# Patient Record
Sex: Female | Born: 1980 | Race: White | Hispanic: No | Marital: Married | Smoking: Former smoker
Health system: Southern US, Community
[De-identification: ages and names within clinical notes are randomized; demographics above are authoritative.]

## PROBLEM LIST (undated history)

## (undated) DIAGNOSIS — J351 Hypertrophy of tonsils: Secondary | ICD-10-CM

## (undated) DIAGNOSIS — E119 Type 2 diabetes mellitus without complications: Secondary | ICD-10-CM

## (undated) DIAGNOSIS — E785 Hyperlipidemia, unspecified: Secondary | ICD-10-CM

## (undated) DIAGNOSIS — O24419 Gestational diabetes mellitus in pregnancy, unspecified control: Secondary | ICD-10-CM

## (undated) DIAGNOSIS — U071 COVID-19: Secondary | ICD-10-CM

## (undated) HISTORY — DX: COVID-19: U07.1

## (undated) HISTORY — DX: Hypertrophy of tonsils: J35.1

## (undated) HISTORY — PX: NO PAST SURGERIES: SHX2092

## (undated) HISTORY — DX: Hyperlipidemia, unspecified: E78.5

## (undated) HISTORY — DX: Type 2 diabetes mellitus without complications: E11.9

## (undated) HISTORY — DX: Gestational diabetes mellitus in pregnancy, unspecified control: O24.419

---

## 2014-03-16 ENCOUNTER — Encounter: Payer: Self-pay | Admitting: Neurology

## 2014-03-19 ENCOUNTER — Institutional Professional Consult (permissible substitution): Payer: Self-pay | Admitting: Neurology

## 2016-05-25 LAB — OB RESULTS CONSOLE GC/CHLAMYDIA
Chlamydia: NEGATIVE
Gonorrhea: NEGATIVE

## 2016-05-25 LAB — OB RESULTS CONSOLE ANTIBODY SCREEN: ANTIBODY SCREEN: NEGATIVE

## 2016-05-25 LAB — OB RESULTS CONSOLE HEPATITIS B SURFACE ANTIGEN: HEP B S AG: NEGATIVE

## 2016-05-25 LAB — OB RESULTS CONSOLE ABO/RH: RH Type: POSITIVE

## 2016-05-25 LAB — OB RESULTS CONSOLE HIV ANTIBODY (ROUTINE TESTING): HIV: NONREACTIVE

## 2016-05-25 LAB — OB RESULTS CONSOLE RUBELLA ANTIBODY, IGM: RUBELLA: UNDETERMINED

## 2016-05-25 LAB — OB RESULTS CONSOLE RPR: RPR: NONREACTIVE

## 2016-09-21 ENCOUNTER — Encounter: Payer: Managed Care, Other (non HMO) | Attending: Obstetrics & Gynecology | Admitting: Skilled Nursing Facility1

## 2016-09-21 DIAGNOSIS — O99810 Abnormal glucose complicating pregnancy: Secondary | ICD-10-CM | POA: Insufficient documentation

## 2016-09-22 ENCOUNTER — Encounter: Payer: Self-pay | Admitting: Skilled Nursing Facility1

## 2016-09-22 NOTE — Progress Notes (Signed)
  Patient was seen on 09/22/2016 for Gestational Diabetes self-management class at the [facility]. The following learning objectives were met by the patient during this course:   States the definition of Gestational Diabetes  States why dietary management is important in controlling blood glucose  Describes the effects each nutrient has on blood glucose levels  Demonstrates ability to create a balanced meal plan  Demonstrates carbohydrate counting   States when to check blood glucose levels involving a total of 4 separate occurences in a day  Demonstrates proper blood glucose monitoring techniques  States the effect of stress and exercise on blood glucose levels  States the importance of limiting caffeine and abstaining from alcohol and smoking  Demonstrates the knowledge the glucometer provided in class may not be covered by their insurance and to call their insurance provider immediately after class to know which glucometer their insurance provider does cover as well as calling their physician the next day for a prescription to the glucometer their insurance does cover (if the one provided is not) as well as the lancets and strips for that meter.  Blood glucose monitor given: contour next Lot # [id number] Exp: 2017-03-28 Blood glucose reading: 89  Patient instructed to monitor glucose levels: FBS: 60 - <90 1 hour: <140 2 hour: <120  *Patient received handouts:  Nutrition Diabetes and Pregnancy  Carbohydrate Counting List  Patient will be seen for follow-up as needed.

## 2016-11-19 ENCOUNTER — Encounter (HOSPITAL_COMMUNITY): Payer: Self-pay | Admitting: *Deleted

## 2016-11-19 ENCOUNTER — Inpatient Hospital Stay (HOSPITAL_COMMUNITY): Payer: Managed Care, Other (non HMO) | Admitting: Anesthesiology

## 2016-11-19 ENCOUNTER — Inpatient Hospital Stay (HOSPITAL_COMMUNITY)
Admission: AD | Admit: 2016-11-19 | Discharge: 2016-11-22 | DRG: 775 | Disposition: A | Discharge status: Home / Self Care | Payer: Managed Care, Other (non HMO) | Source: Ambulatory Visit | Attending: Obstetrics and Gynecology | Admitting: Obstetrics and Gynecology

## 2016-11-19 DIAGNOSIS — Z8249 Family history of ischemic heart disease and other diseases of the circulatory system: Secondary | ICD-10-CM | POA: Diagnosis not present

## 2016-11-19 DIAGNOSIS — O26643 Intrahepatic cholestasis of pregnancy, third trimester: Secondary | ICD-10-CM | POA: Diagnosis present

## 2016-11-19 DIAGNOSIS — O24420 Gestational diabetes mellitus in childbirth, diet controlled: Secondary | ICD-10-CM | POA: Diagnosis present

## 2016-11-19 DIAGNOSIS — Z3A36 36 weeks gestation of pregnancy: Secondary | ICD-10-CM

## 2016-11-19 DIAGNOSIS — K831 Obstruction of bile duct: Secondary | ICD-10-CM | POA: Diagnosis present

## 2016-11-19 DIAGNOSIS — Z6790 Unspecified blood type, Rh positive: Secondary | ICD-10-CM

## 2016-11-19 DIAGNOSIS — O3663X0 Maternal care for excessive fetal growth, third trimester, not applicable or unspecified: Secondary | ICD-10-CM | POA: Diagnosis present

## 2016-11-19 DIAGNOSIS — O26613 Liver and biliary tract disorders in pregnancy, third trimester: Secondary | ICD-10-CM

## 2016-11-19 DIAGNOSIS — Z87891 Personal history of nicotine dependence: Secondary | ICD-10-CM

## 2016-11-19 DIAGNOSIS — Z833 Family history of diabetes mellitus: Secondary | ICD-10-CM

## 2016-11-19 DIAGNOSIS — O99214 Obesity complicating childbirth: Secondary | ICD-10-CM | POA: Diagnosis present

## 2016-11-19 DIAGNOSIS — O1420 HELLP syndrome (HELLP), unspecified trimester: Secondary | ICD-10-CM | POA: Diagnosis present

## 2016-11-19 DIAGNOSIS — Z3490 Encounter for supervision of normal pregnancy, unspecified, unspecified trimester: Secondary | ICD-10-CM

## 2016-11-19 DIAGNOSIS — Z6838 Body mass index (BMI) 38.0-38.9, adult: Secondary | ICD-10-CM

## 2016-11-19 DIAGNOSIS — O1424 HELLP syndrome, complicating childbirth: Secondary | ICD-10-CM | POA: Diagnosis present

## 2016-11-19 DIAGNOSIS — O2662 Liver and biliary tract disorders in childbirth: Secondary | ICD-10-CM | POA: Diagnosis present

## 2016-11-19 DIAGNOSIS — O1490 Unspecified pre-eclampsia, unspecified trimester: Secondary | ICD-10-CM | POA: Diagnosis present

## 2016-11-19 LAB — CBC
HCT: 41.7 % (ref 36.0–46.0)
HEMATOCRIT: 42.5 % (ref 36.0–46.0)
HEMOGLOBIN: 14.8 g/dL (ref 12.0–15.0)
Hemoglobin: 14.4 g/dL (ref 12.0–15.0)
MCH: 30.5 pg (ref 26.0–34.0)
MCH: 30.5 pg (ref 26.0–34.0)
MCHC: 34.8 g/dL (ref 30.0–36.0)
MCHC: 34.5 g/dL (ref 30.0–36.0)
MCV: 87.6 fL (ref 78.0–100.0)
MCV: 88.3 fL (ref 78.0–100.0)
PLATELETS: 177 10*3/uL (ref 150–400)
Platelets: 164 10*3/uL (ref 150–400)
RBC: 4.85 MIL/uL (ref 3.87–5.11)
RBC: 4.72 MIL/uL (ref 3.87–5.11)
RDW: 13.3 % (ref 11.5–15.5)
RDW: 13.3 % (ref 11.5–15.5)
WBC: 6.8 10*3/uL (ref 4.0–10.5)
WBC: 11.5 10*3/uL — AB (ref 4.0–10.5)

## 2016-11-19 LAB — COMPREHENSIVE METABOLIC PANEL
ALK PHOS: 194 U/L — AB (ref 38–126)
ALK PHOS: 201 U/L — AB (ref 38–126)
ALT: 568 U/L — AB (ref 14–54)
ALT: 618 U/L — ABNORMAL HIGH (ref 14–54)
ANION GAP: 9 (ref 5–15)
ANION GAP: 13 (ref 5–15)
AST: 241 U/L — ABNORMAL HIGH (ref 15–41)
AST: 281 U/L — ABNORMAL HIGH (ref 15–41)
Albumin: 3.1 g/dL — ABNORMAL LOW (ref 3.5–5.0)
Albumin: 3.2 g/dL — ABNORMAL LOW (ref 3.5–5.0)
BILIRUBIN TOTAL: 0.9 mg/dL (ref 0.3–1.2)
BUN: 15 mg/dL (ref 6–20)
BUN: 14 mg/dL (ref 6–20)
CALCIUM: 9 mg/dL (ref 8.9–10.3)
CHLORIDE: 101 mmol/L (ref 101–111)
CO2: 21 mmol/L — ABNORMAL LOW (ref 22–32)
CO2: 20 mmol/L — ABNORMAL LOW (ref 22–32)
CREATININE: 0.8 mg/dL (ref 0.44–1.00)
CREATININE: 0.82 mg/dL (ref 0.44–1.00)
Calcium: 9.4 mg/dL (ref 8.9–10.3)
Chloride: 103 mmol/L (ref 101–111)
Glucose, Bld: 90 mg/dL (ref 65–99)
Glucose, Bld: 83 mg/dL (ref 65–99)
Potassium: 4.2 mmol/L (ref 3.5–5.1)
Potassium: 4.6 mmol/L (ref 3.5–5.1)
SODIUM: 134 mmol/L — AB (ref 135–145)
Sodium: 133 mmol/L — ABNORMAL LOW (ref 135–145)
TOTAL PROTEIN: 7.6 g/dL (ref 6.5–8.1)
Total Bilirubin: 1 mg/dL (ref 0.3–1.2)
Total Protein: 7.6 g/dL (ref 6.5–8.1)

## 2016-11-19 LAB — APTT: aPTT: 28 seconds (ref 24–36)

## 2016-11-19 LAB — URIC ACID
URIC ACID, SERUM: 8.1 mg/dL — AB (ref 2.3–6.6)
Uric Acid, Serum: 7.8 mg/dL — ABNORMAL HIGH (ref 2.3–6.6)

## 2016-11-19 LAB — PROTIME-INR
INR: 0.93
PROTHROMBIN TIME: 12.4 s (ref 11.4–15.2)

## 2016-11-19 LAB — TYPE AND SCREEN
ABO/RH(D): AB POS
ANTIBODY SCREEN: NEGATIVE

## 2016-11-19 LAB — GLUCOSE, CAPILLARY: Glucose-Capillary: 117 mg/dL — ABNORMAL HIGH (ref 65–99)

## 2016-11-19 LAB — ABO/RH: ABO/RH(D): AB POS

## 2016-11-19 MED ORDER — LACTATED RINGERS IV SOLN: 500.0000 mL | Freq: Once | INTRAVENOUS | Status: DC

## 2016-11-19 MED ORDER — MAGNESIUM SULFATE BOLUS VIA INFUSION
4.0000 g | Freq: Once | INTRAVENOUS | Status: AC
  Administered 2016-11-19: 4 g via INTRAVENOUS
  Filled 2016-11-19: qty 500

## 2016-11-19 MED ORDER — LIDOCAINE HCL (PF) 1 % IJ SOLN
INTRAMUSCULAR | Status: DC | PRN
  Administered 2016-11-19 (×2): 4 mL

## 2016-11-19 MED ORDER — LACTATED RINGERS IV SOLN
INTRAVENOUS | Status: DC
  Administered 2016-11-19: 500 mL via INTRAVENOUS
  Administered 2016-11-19 – 2016-11-20 (×6): via INTRAVENOUS

## 2016-11-19 MED ORDER — FLEET ENEMA 7-19 GM/118ML RE ENEM: 1.0000 | ENEMA | RECTAL | Status: DC | PRN

## 2016-11-19 MED ORDER — OXYTOCIN BOLUS FROM INFUSION
500.0000 mL | Freq: Once | INTRAVENOUS | Status: AC
  Administered 2016-11-20: 500 mL via INTRAVENOUS

## 2016-11-19 MED ORDER — PENICILLIN G POT IN DEXTROSE 60000 UNIT/ML IV SOLN
3.0000 10*6.[IU] | INTRAVENOUS | Status: DC
  Administered 2016-11-20 (×2): 3 10*6.[IU] via INTRAVENOUS
  Filled 2016-11-19 (×8): qty 50

## 2016-11-19 MED ORDER — TERBUTALINE SULFATE 1 MG/ML IJ SOLN: 0.2500 mg | Freq: Once | INTRAMUSCULAR | Status: DC | PRN

## 2016-11-19 MED ORDER — OXYTOCIN 40 UNITS IN LACTATED RINGERS INFUSION - SIMPLE MED
2.5000 [IU]/h | INTRAVENOUS | Status: DC
  Filled 2016-11-19: qty 1000

## 2016-11-19 MED ORDER — ONDANSETRON HCL 4 MG/2ML IJ SOLN: 4.0000 mg | Freq: Four times a day (QID) | INTRAMUSCULAR | Status: DC | PRN

## 2016-11-19 MED ORDER — PENICILLIN G POTASSIUM 5000000 UNITS IJ SOLR
5.0000 10*6.[IU] | Freq: Once | INTRAVENOUS | Status: AC
  Administered 2016-11-19: 5 10*6.[IU] via INTRAVENOUS
  Filled 2016-11-19: qty 5

## 2016-11-19 MED ORDER — OXYCODONE-ACETAMINOPHEN 5-325 MG PO TABS: 2.0000 | ORAL_TABLET | ORAL | Status: DC | PRN

## 2016-11-19 MED ORDER — LACTATED RINGERS IV SOLN
500.0000 mL | INTRAVENOUS | Status: DC | PRN
  Administered 2016-11-19: 500 mL via INTRAVENOUS

## 2016-11-19 MED ORDER — ACETAMINOPHEN 325 MG PO TABS: 650.0000 mg | ORAL_TABLET | ORAL | Status: DC | PRN

## 2016-11-19 MED ORDER — EPHEDRINE 5 MG/ML INJ
10.0000 mg | INTRAVENOUS | Status: DC | PRN
  Filled 2016-11-19: qty 2

## 2016-11-19 MED ORDER — OXYTOCIN 40 UNITS IN LACTATED RINGERS INFUSION - SIMPLE MED
1.0000 m[IU]/min | INTRAVENOUS | Status: DC
  Administered 2016-11-19: 6 m[IU]/min via INTRAVENOUS
  Administered 2016-11-19: 14 m[IU]/min via INTRAVENOUS
  Administered 2016-11-19: 10 m[IU]/min via INTRAVENOUS
  Administered 2016-11-19 (×2): 4 m[IU]/min via INTRAVENOUS
  Administered 2016-11-19: 8 m[IU]/min via INTRAVENOUS
  Administered 2016-11-19: 2 m[IU]/min via INTRAVENOUS
  Administered 2016-11-19: 12 m[IU]/min via INTRAVENOUS
  Administered 2016-11-20: 16 m[IU]/min via INTRAVENOUS

## 2016-11-19 MED ORDER — LIDOCAINE HCL (PF) 1 % IJ SOLN
30.0000 mL | INTRAMUSCULAR | Status: DC | PRN
  Filled 2016-11-19: qty 30

## 2016-11-19 MED ORDER — FENTANYL 2.5 MCG/ML BUPIVACAINE 1/10 % EPIDURAL INFUSION (WH - ANES)
14.0000 mL/h | INTRAMUSCULAR | Status: DC | PRN
  Administered 2016-11-19 – 2016-11-20 (×2): 14 mL/h via EPIDURAL
  Filled 2016-11-19 (×2): qty 100

## 2016-11-19 MED ORDER — PHENYLEPHRINE 40 MCG/ML (10ML) SYRINGE FOR IV PUSH (FOR BLOOD PRESSURE SUPPORT)
80.0000 ug | PREFILLED_SYRINGE | INTRAVENOUS | Status: DC | PRN
  Administered 2016-11-19: 80 ug via INTRAVENOUS
  Filled 2016-11-19: qty 10
  Filled 2016-11-19: qty 5

## 2016-11-19 MED ORDER — DIPHENHYDRAMINE HCL 50 MG/ML IJ SOLN: 12.5000 mg | INTRAMUSCULAR | Status: DC | PRN

## 2016-11-19 MED ORDER — PHENYLEPHRINE 40 MCG/ML (10ML) SYRINGE FOR IV PUSH (FOR BLOOD PRESSURE SUPPORT)
80.0000 ug | PREFILLED_SYRINGE | INTRAVENOUS | Status: DC | PRN
  Administered 2016-11-19: 80 ug via INTRAVENOUS
  Filled 2016-11-19: qty 5

## 2016-11-19 MED ORDER — MAGNESIUM SULFATE 40 G IN LACTATED RINGERS - SIMPLE
2.0000 g/h | INTRAVENOUS | Status: DC
  Administered 2016-11-20 – 2016-11-21 (×2): 2 g/h via INTRAVENOUS
  Filled 2016-11-19: qty 40
  Filled 2016-11-19: qty 500
  Filled 2016-11-19: qty 40

## 2016-11-19 MED ORDER — OXYCODONE-ACETAMINOPHEN 5-325 MG PO TABS: 1.0000 | ORAL_TABLET | ORAL | Status: DC | PRN

## 2016-11-19 MED ORDER — SOD CITRATE-CITRIC ACID 500-334 MG/5ML PO SOLN: 30.0000 mL | ORAL | Status: DC | PRN

## 2016-11-19 MED ORDER — FENTANYL 2.5 MCG/ML BUPIVACAINE 1/10 % EPIDURAL INFUSION (WH - ANES): 14.0000 mL/h | INTRAMUSCULAR | Status: DC | PRN

## 2016-11-19 NOTE — Progress Notes (Signed)
ANTEPARTUM NOTE - Hospital Day 1  Subjective: S/p epidural, was beginning to feel contractions as more uncomfortable; still anxious In room just after epidural and pt feeling lightheaded and sweaty  Objective: Vital signs: VS: Blood pressure 124/85, pulse 97, temperature 98.4 F (36.9 C), temperature source Oral, resp. rate 20, height 5' 4"  (1.626 m), weight 227 lb (103 kg), SpO2 98 %. Physical exam:   I/O:  In total: 4600 Output documented: 569m; please note that this does not accurately reflect pt output as I visualized hat with urine that was apparently not documented; at this time uncertain as to the patient's voiding   Patient Vitals for the past 24 hrs:  BP Temp Temp src Pulse Resp SpO2 Height Weight  11/19/16 2051 124/85 - - 97 - - - -  11/19/16 2033 (!) 93/56 - - 88 - - - -  11/19/16 2031 (!) 88/57 - - 88 - - - -  11/19/16 2028 (!) 82/54 - - 93 - - - -  11/19/16 2025 (!) 115/93 - - (!) 110 - 98 % - -  11/19/16 2021 99/81 - - (!) 128 - - - -  11/19/16 2017 - - - - - 97 % - -  11/19/16 2010 - - - - - 97 % - -  11/19/16 2005 (!) 161/111 - - (!) 123 - 96 % - -  11/19/16 2000 (!) 161/119 - - (!) 107 - 97 % - -  11/19/16 1831 (!) 157/88 - - (!) 114 - - - -  11/19/16 1801 (!) 157/96 - - (!) 110 - - - -  11/19/16 1731 (!) 165/87 98.4 F (36.9 C) Oral (!) 115 20 - - -  11/19/16 1714 (!) 146/92 - - (!) 107 - - - -  11/19/16 1608 138/86 98.6 F (37 C) Oral (!) 102 18 - - -  11/19/16 1400 113/75 - - (!) 105 - 96 % - -  11/19/16 1355 - - - - - 97 % - -  11/19/16 1352 (!) 147/88 - - (!) 107 - - - -  11/19/16 1341 (!) 133/92 - - 97 - - - -  11/19/16 1331 (!) 137/94 - - 98 - - - -  11/19/16 1321 (!) 125/94 - - 100 18 - - -  11/19/16 1251 - - - - 18 - - -  11/19/16 1207 (!) 131/99 - - 99 18 - - -  11/19/16 1155 (!) 136/101 97.9 F (36.6 C) Oral (!) 107 18 - 5' 4"  (1.626 m) 227 lb (103 kg)          General appearance/behavior: alert&orientedx3              Lungs: nml  respirations       Abdomen: soft, nt, gravid       Cx: 58/54/-6 cephalic, clear fluid; no blood noted except small old clot; iupc placed       Extremities: no edema, nt bilat                Fetal Assessment:  FHR 140s, +accels, 2 late decels during hypotension after epidural, lasted about 1 min each with return to baseline; cat 1-2 TOCO irreg q 2-4   Assessment/Plan: iup at 36 2 1.  HELLP: repeated labs again with nml coags (ordered d/t increased bleeding), plts stable, slight increase in lfts; contin plan with pitocin IOL; watch closely for any change; please note that with nursing turn over, it was discovered that  the pitocin was not running as ordered and was barely getting pitocin during this indxn time d/t nursing error setting pump; this was corrected and plan to increase by 73m/min q 30 min as ordered and this plan was reviewed with the new nursing team; plan svd.  Reviewed contin mag sulfate and follow I/os closely; plan repeat labs in  6 hours if not delivered; watch for further bleeding; possible cholestasis of pregnancy as bile acids not yet back; no signs/sx mag toxicity; prior nurse did not record I/o's and appears output was not entered; it is likely that deficit is not as severe as it appears since I witnessed a void that is not documented; current team aware of strict I/o's and need to watch pt closely 2. Fetal status overall reassuring - no further decelerations  And obs closely 3. gdma1 - check fsbs q 4 hr, stable and wnl 4. gbs unknown - pcn for prophylaxis; this was not yet started by prior nurse per order and new nurse team aware to begin now, pt in active labor 5. Rubella equiv - mmr pp

## 2016-11-19 NOTE — Progress Notes (Signed)
Nursing called to inform of blood in hat after pt had voided; reported red clot about 3cm; no active bleeding noted Pt says happened about 45 min ago Pt w/o c/o, beginning to feel contractions No n/v, sob; no c/o; pt is very anxious about all of the medical issues   Vs: 130s-140s/80s-90s; did have diastolic of 101 on admission 102, 18, 98.6  A&x3 Abd: soft, nt, gravid Cx: 4/80/-3, cephalic, arom with clear and ?blood tinged fluid; clotted blood noted on perineum during exam and arom, no active bleeding noted with arom, appeared clear fluid that was blood tinged, again no frank blood noted LE: no edema, nt bilat  Intake/Output Summary (Last 24 hours) at 11/19/16 1808 Last data filed at 11/19/16 1755  Gross per 24 hour  Intake          1069.57 ml  Output              300 ml  Net           769.57 ml     FHT: 140s, +accels, no decels, cat 1 Toco: irreg, q 2-3 min  A/p: iup at 36 2, HELLP 1. D/t increased bleeding will repeat labs with coags, cbc/cmp/pt/ptt/inr; will observe closely for any further bleeding, appears that this is not active bleeding and may have occurred with cervical change, no blood noted with arom; possible abruption and will follow closely; I have reviewed all of this with the patient and will continue the pitocin at 294mU/min for now until stable; reviewed likely c/s for any further bleeding episodes or change in status (maternal or fetal); pt and husband aware and questions answered 2. Fetal status reassuring 3. HELLP: repeat labs pending, contin mag sulfate; follow I/o's closely; bps wnl or low mild range 4. GDMA1: has been controlled with diet, fsbs wnl on admission,

## 2016-11-19 NOTE — Anesthesia Preprocedure Evaluation (Addendum)
Anesthesia Evaluation  Patient identified by MRN, date of birth, ID band Patient awake    Reviewed: Allergy & Precautions, NPO status , Patient's Chart, lab work & pertinent test results  Airway Mallampati: II  TM Distance: >3 FB Neck ROM: Full    Dental no notable dental hx.    Pulmonary neg pulmonary ROS, former smoker,    Pulmonary exam normal breath sounds clear to auscultation       Cardiovascular hypertension, Normal cardiovascular exam Rhythm:Regular Rate:Normal     Neuro/Psych negative neurological ROS  negative psych ROS   GI/Hepatic negative GI ROS, Patient received Oral Contrast Agents,(+) Hepatitis -HELLP   Endo/Other  diabetesMorbid obesity  Renal/GU negative Renal ROS     Musculoskeletal negative musculoskeletal ROS (+)   Abdominal   Peds  Hematology negative hematology ROS (+)   Anesthesia Other Findings   Reproductive/Obstetrics (+) Pregnancy                            Anesthesia Physical Anesthesia Plan  ASA: III  Anesthesia Plan: Epidural   Post-op Pain Management:    Induction:   Airway Management Planned:   Additional Equipment:   Intra-op Plan:   Post-operative Plan:   Informed Consent: I have reviewed the patients History and Physical, chart, labs and discussed the procedure including the risks, benefits and alternatives for the proposed anesthesia with the patient or authorized representative who has indicated his/her understanding and acceptance.     Plan Discussed with:   Anesthesia Plan Comments:        Anesthesia Quick Evaluation

## 2016-11-19 NOTE — H&P (Addendum)
OB ADMISSION/ HISTORY & PHYSICAL:  Admission Date: 11/19/2016 11:28 AM  Admit Diagnosis: HELLP  Mary Barrett is a 36 y.o. female at 10 2 wga by early u/s sent from office to l/d for delivery d/t abnormal lab tests.  The patient was seen 1 day ago for itching and PIH labs done.  Plts 149, with AST 230 and ALT 610 and resulted this am.  The patient presented w/o c/o headache, vision changes, RUQ pain, denies any vaginal bleeding, contractions or leaking fluid.  +FM    Prenatal History: G5P0040   EDC : 12/15/2016, by Other Basis  Prenatal care at [facility] Provider: Mody Prenatal course complicated by GDMA1 that was controlled with diet only  Prenatal Labs: ABO, Rh: --/--/AB POS, AB POS (05/24 1220) Antibody: NEG (05/24 1220) Rubella: Equivocal (11/27 0000)  RPR: Nonreactive (11/27 0000)  HBsAg: Negative (11/27 0000)  HIV: Non-reactive (11/27 0000)  GTT: failed 3 hr GBS:  done 5/23 and not yet avail, unknown  Medical / Surgical History :  Past medical history:  Past Medical History:  Diagnosis Date  . Diabetes mellitus without complication ([place])   . Tonsillar hypertrophy      Past surgical history:  Past Surgical History:  Procedure Laterality Date  . NO PAST SURGERIES      Family History:  Family History  Problem Relation Age of Onset  . Diabetes Mother   . Hypertension Mother   . Diabetes Father   . Hypertension Father   . Cancer Maternal Grandmother   . Alcoholism Brother      Social History:  reports that she quit smoking about 2 years ago. She has never used smokeless tobacco. She reports that she does not drink alcohol or use drugs.   Allergies: Patient has no known allergies.    Current Medications at time of admission:  Prior to Admission medications   Medication Sig Start Date End Date Taking? Authorizing Provider  omega-3 acid ethyl esters (LOVAZA) 1 g capsule Take 1 g by mouth daily.   Yes [provider]  Prenatal Vit-Fe  Fumarate-FA (PRENATAL MULTIVITAMIN) TABS tablet Take 1 tablet by mouth daily at 12 noon.   Yes [provider]     Review of Systems: See above HPI  Physical Exam: Vs: 125-140s/80s-90s General: alert and oriented, appears in no distress Heart: RRR Lungs: Clear lung fields Abdomen: Gravid, soft and non-tender, gravid; efw 6 1/2-7 lbs Extremities: tr edema CX: 9/37/-9, cephalic, mid to posterior   FHR: baseline rate 140s / variability nml / accelerations + / no decelerations; cat 1 TOCO: irregular q4-8    Labs reviewed: ast 241, alt 568, plts 164, otherwise wnl   Assessment/Plan: G5P0040 at 36 [redacted] weeks gestation 1. hellp - admit for delivery; reviewed stable labs and with cervical exam, recommend plan for IOL with pitocin, will follow closely and if worsening maternal or fetal status will reassess; on magnesium sulfate, plan arom with descent of fetal head; bps wnl or low mild range and will follow closely; strict I/o; uric acid done at office but no results avail 2. Fetal status reassuring, contin efm 3. gdma1 - fsbs with admission 98, check q 4 hrs in latent labor 4. gbs - no result available, plan gbs prophylaxis if unable to get result (done 1 day ago) 5. Rh pos 6. Rubella equiv - mmr pp

## 2016-11-19 NOTE — Anesthesia Procedure Notes (Addendum)
Epidural Patient location during procedure: OB  Staffing Anesthesiologist: Mellissa Conley Performed: anesthesiologist   Preanesthetic Checklist Completed: patient identified, pre-op evaluation, timeout performed, IV checked, risks and benefits discussed and monitors and equipment checked  Epidural Patient position: sitting Prep: site prepped and draped and DuraPrep Patient monitoring: heart rate, continuous pulse ox and blood pressure Approach: midline Location: L3-L4 Injection technique: LOR air and LOR saline  Needle:  Needle type: Tuohy  Needle gauge: 17 G Needle length: 9 cm Needle insertion depth: 7 cm Catheter type: closed end flexible Catheter size: 19 Gauge Catheter at skin depth: 13 cm Test dose: negative  Assessment Sensory level: T8 Events: blood not aspirated, injection not painful, no injection resistance, negative IV test and no paresthesia  Additional Notes Reason for block:procedure for pain     

## 2016-11-19 NOTE — H&P (Signed)
Leafy Halfnnmarie Gibb is a 36 y.o. 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  [id number], spontaneous pregnancy.  GDM- A1, well controlled.  She presented to office yesterday with itching, bile acids sent and CBC, LFT sent. Labs back this morning with elevated LFTs and low platelets, so pt was asked to present for delivery. Denies HA/ blurred vision/ RUQ pain/ swelling.  Anxious due to this diagnosis   OB History    Gravida Para Term Preterm AB Living   5 0 0 0 4 0   SAB TAB Ectopic Multiple Live Births   1 3 0 0 0     Past Medical History:  Diagnosis Date  . Diabetes mellitus without complication ([facility])   . Tonsillar hypertrophy    Past Surgical History:  Procedure Laterality Date  . NO PAST SURGERIES     Family History: family history includes Alcoholism in her brother; Cancer in her maternal grandmother; Diabetes in her father and mother; Hypertension in her father and mother. Social History:  reports that she quit smoking about 2 years ago. She has never used smokeless tobacco. She reports that she does not drink alcohol or use drugs.     Maternal Diabetes: Yes:  Diabetes Type:  Diet controlled Genetic Screening: Normal- Panorama normal  Maternal Ultrasounds/Referrals: Normal Fetal Ultrasounds or other Referrals:  None Maternal Substance Abuse:  No Significant Maternal Medications:  None Significant Maternal Lab Results:  Lab values include: Other:  GBS pending  Other Comments: none  ROS History Dilation: 2.5 Station:  (high) Exam by:: Mycah Mcdougall Blood pressure (!) 131/99, pulse 99, temperature 97.9 F (36.6 C), temperature source Oral, resp. rate 18, height 5\' 4"  (1.626 m), weight 227 lb (103 kg). Exam Physical Exam  Physical exam:  A&O x 3, no acute distress. Pleasant HEENT neg, no thyromegaly Lungs CTA bilat CV RRR, S1S2 normal Abdo soft, non tender, non acute Extr no edema/ tenderness Pelvic 2-3 cm/ soft cx, 50%/ -3 at inlet. Android  pelvis FHT  140s/ mod variab/ no decels/ +accels Toco regular q 4-5 min, pt not appreciating any  Prenatal labs: ABO, Rh: AB/Positive/-- (11/27 0000) Antibody: Negative (11/27 0000) Rubella: Equivocal (11/27 0000) RPR: Nonreactive (11/27 0000)  HBsAg: Negative (11/27 0000)  HIV: Non-reactive (11/27 0000)  GBS:   pending Glucola abnormal  Assessment/Plan:36 yo female G5P0040, AMA, spontaneous pregnancy. A1GDM well controlled, now with HELLP syndrome at 36.3 wks. Admitted for delivery. Reviewed PEC and HELLP, risk to mother incl seizure/ bleeding/ liver hematoma/ pulm edema/ etc, Neo risks incl abruption/ fetal distress etc. Currently BPs good and no indication to start antiHTN meds. Fresh labs sent at admission, continue every 6 hrs. Inform anesthesia. Rapid GBS not sent as pt just had a pelvic exam, will start PCN per protocol. Reviewed Eclampsia risk, recommend magnesium sulphate and agrees, risks/ side effects . Cholestasis not rued out as office bile acids pending.  Favorable cervix. Reviewed C/section vs IOL risks/ benefits. Pt would have preferred vaginal delivery but accepts C/section as needed. D/w on call MD Dr Amado NashAlmquist, pitocin seems reasonable due to cx 2-3 cm and not except LGA infant. Borderline/ Android pelvis reviewed with pt and Dr Amado NashAlmquist and pt prepared for C/section as needed. reviewed.  Pt counseled and d/w Dr Amado NashAlmquist who will assume care for this patient.    Regnia Mathwig R 11/19/2016, 1:13 PM

## 2016-11-19 NOTE — Anesthesia Pain Management Evaluation Note (Signed)
  CRNA Pain Management Visit Note  Patient: Mary Barrett, 36 y.o., female  "Hello I am a member of the anesthesia team at [facility]. We have an anesthesia team available at all times to provide care throughout the hospital, including epidural management and anesthesia for C-section. I don't know your plan for the delivery whether it a natural birth, water birth, IV sedation, nitrous supplementation, doula or epidural, but we want to meet your pain goals."   1.Was your pain managed to your expectations on prior hospitalizations?   No prior hospitalizations  2.What is your expectation for pain management during this hospitalization?     Epidural  3.How can we help you reach that goal? unsure  Record the patient's initial score and the patient's pain goal.   Pain: 0  Pain Goal: 8 [facility] wants you to be able to say your pain was always managed very well.  Cephus ShellingBURGER,[place] 11/19/2016

## 2016-11-20 ENCOUNTER — Encounter (HOSPITAL_COMMUNITY): Payer: Self-pay

## 2016-11-20 DIAGNOSIS — O26613 Liver and biliary tract disorders in pregnancy, third trimester: Secondary | ICD-10-CM

## 2016-11-20 DIAGNOSIS — K831 Obstruction of bile duct: Secondary | ICD-10-CM | POA: Diagnosis present

## 2016-11-20 DIAGNOSIS — O26643 Intrahepatic cholestasis of pregnancy, third trimester: Secondary | ICD-10-CM | POA: Diagnosis present

## 2016-11-20 LAB — CBC
HCT: 35.4 % — ABNORMAL LOW (ref 36.0–46.0)
HCT: 32.2 % — ABNORMAL LOW (ref 36.0–46.0)
HEMATOCRIT: 39.4 % (ref 36.0–46.0)
HEMOGLOBIN: 13.5 g/dL (ref 12.0–15.0)
Hemoglobin: 12.1 g/dL (ref 12.0–15.0)
Hemoglobin: 11.2 g/dL — ABNORMAL LOW (ref 12.0–15.0)
MCH: 30 pg (ref 26.0–34.0)
MCH: 30.1 pg (ref 26.0–34.0)
MCH: 30.5 pg (ref 26.0–34.0)
MCHC: 34.2 g/dL (ref 30.0–36.0)
MCHC: 34.3 g/dL (ref 30.0–36.0)
MCHC: 34.8 g/dL (ref 30.0–36.0)
MCV: 87.7 fL (ref 78.0–100.0)
MCV: 87.8 fL (ref 78.0–100.0)
MCV: 87.9 fL (ref 78.0–100.0)
PLATELETS: 166 10*3/uL (ref 150–400)
PLATELETS: 166 10*3/uL (ref 150–400)
Platelets: 164 10*3/uL (ref 150–400)
RBC: 3.67 MIL/uL — ABNORMAL LOW (ref 3.87–5.11)
RBC: 4.03 MIL/uL (ref 3.87–5.11)
RBC: 4.48 MIL/uL (ref 3.87–5.11)
RDW: 13.4 % (ref 11.5–15.5)
RDW: 13.4 % (ref 11.5–15.5)
RDW: 13.6 % (ref 11.5–15.5)
WBC: 12.5 10*3/uL — ABNORMAL HIGH (ref 4.0–10.5)
WBC: 17.6 10*3/uL — AB (ref 4.0–10.5)
WBC: 22.2 10*3/uL — ABNORMAL HIGH (ref 4.0–10.5)

## 2016-11-20 LAB — COMPREHENSIVE METABOLIC PANEL
ALBUMIN: 2.5 g/dL — AB (ref 3.5–5.0)
ALT: 453 U/L — ABNORMAL HIGH (ref 14–54)
ALT: 606 U/L — ABNORMAL HIGH (ref 14–54)
ANION GAP: 10 (ref 5–15)
ANION GAP: 9 (ref 5–15)
AST: 208 U/L — AB (ref 15–41)
AST: 292 U/L — ABNORMAL HIGH (ref 15–41)
Albumin: 3 g/dL — ABNORMAL LOW (ref 3.5–5.0)
Alkaline Phosphatase: 135 U/L — ABNORMAL HIGH (ref 38–126)
Alkaline Phosphatase: 169 U/L — ABNORMAL HIGH (ref 38–126)
BILIRUBIN TOTAL: 1.4 mg/dL — AB (ref 0.3–1.2)
BUN: 13 mg/dL (ref 6–20)
BUN: 14 mg/dL (ref 6–20)
CALCIUM: 8 mg/dL — AB (ref 8.9–10.3)
CHLORIDE: 103 mmol/L (ref 101–111)
CO2: 21 mmol/L — AB (ref 22–32)
CO2: 21 mmol/L — ABNORMAL LOW (ref 22–32)
CREATININE: 0.86 mg/dL (ref 0.44–1.00)
Calcium: 7.3 mg/dL — ABNORMAL LOW (ref 8.9–10.3)
Chloride: 99 mmol/L — ABNORMAL LOW (ref 101–111)
Creatinine, Ser: 0.95 mg/dL (ref 0.44–1.00)
GFR calc Af Amer: >60 mL/min (ref >60)
GFR calc non Af Amer: >60 mL/min (ref >60)
GFR calc non Af Amer: >60 mL/min (ref >60)
GLUCOSE: 179 mg/dL — AB (ref 65–99)
GLUCOSE: 99 mg/dL (ref 65–99)
POTASSIUM: 4.1 mmol/L (ref 3.5–5.1)
Potassium: 4.3 mmol/L (ref 3.5–5.1)
SODIUM: 133 mmol/L — AB (ref 135–145)
Sodium: 130 mmol/L — ABNORMAL LOW (ref 135–145)
TOTAL PROTEIN: 6.9 g/dL (ref 6.5–8.1)
Total Bilirubin: 1.1 mg/dL (ref 0.3–1.2)
Total Protein: 5.7 g/dL — ABNORMAL LOW (ref 6.5–8.1)

## 2016-11-20 LAB — GLUCOSE, CAPILLARY
GLUCOSE-CAPILLARY: 121 mg/dL — AB (ref 65–99)
Glucose-Capillary: 106 mg/dL — ABNORMAL HIGH (ref 65–99)

## 2016-11-20 LAB — OB RESULTS CONSOLE GBS: STREP GROUP B AG: NEGATIVE

## 2016-11-20 LAB — RPR: RPR Ser Ql: NONREACTIVE

## 2016-11-20 MED ORDER — COCONUT OIL OIL
1.0000 "application " | TOPICAL_OIL | Status: DC | PRN
  Administered 2016-11-21: 1 via TOPICAL
  Filled 2016-11-20: qty 120

## 2016-11-20 MED ORDER — WITCH HAZEL-GLYCERIN EX PADS
1.0000 | MEDICATED_PAD | CUTANEOUS | Status: DC | PRN
Start: 2016-11-20 — End: 2016-11-22

## 2016-11-20 MED ORDER — MEASLES, MUMPS & RUBELLA VAC ~~LOC~~ INJ
0.5000 mL | INJECTION | Freq: Once | SUBCUTANEOUS | Status: DC
  Filled 2016-11-20: qty 0.5

## 2016-11-20 MED ORDER — IBUPROFEN 600 MG PO TABS
600.0000 mg | ORAL_TABLET | Freq: Four times a day (QID) | ORAL | Status: DC
  Administered 2016-11-21: 600 mg via ORAL
  Filled 2016-11-20 (×4): qty 1

## 2016-11-20 MED ORDER — LACTATED RINGERS IV SOLN
INTRAVENOUS | Status: DC
  Administered 2016-11-20: 100 mL/h via INTRAVENOUS
  Administered 2016-11-20: 14:00:00 via INTRAVENOUS

## 2016-11-20 MED ORDER — BENZOCAINE-MENTHOL 20-0.5 % EX AERO
1.0000 "application " | INHALATION_SPRAY | CUTANEOUS | Status: DC | PRN
  Administered 2016-11-20: 1 via TOPICAL
  Filled 2016-11-20: qty 56

## 2016-11-20 MED ORDER — ONDANSETRON HCL 4 MG PO TABS: 4.0000 mg | ORAL_TABLET | ORAL | Status: DC | PRN

## 2016-11-20 MED ORDER — SENNOSIDES-DOCUSATE SODIUM 8.6-50 MG PO TABS
2.0000 | ORAL_TABLET | ORAL | Status: DC
  Filled 2016-11-20: qty 2

## 2016-11-20 MED ORDER — ONDANSETRON HCL 4 MG/2ML IJ SOLN: 4.0000 mg | INTRAMUSCULAR | Status: DC | PRN

## 2016-11-20 MED ORDER — MISOPROSTOL 200 MCG PO TABS
ORAL_TABLET | ORAL | Status: AC
  Filled 2016-11-20: qty 4

## 2016-11-20 MED ORDER — ACETAMINOPHEN 325 MG PO TABS: 650.0000 mg | ORAL_TABLET | ORAL | Status: DC | PRN

## 2016-11-20 MED ORDER — SIMETHICONE 80 MG PO CHEW: 80.0000 mg | CHEWABLE_TABLET | ORAL | Status: DC | PRN

## 2016-11-20 MED ORDER — DIBUCAINE 1 % RE OINT: 1.0000 "application " | TOPICAL_OINTMENT | RECTAL | Status: DC | PRN

## 2016-11-20 MED ORDER — DIPHENHYDRAMINE HCL 25 MG PO CAPS: 25.0000 mg | ORAL_CAPSULE | Freq: Four times a day (QID) | ORAL | Status: DC | PRN

## 2016-11-20 MED ORDER — PRENATAL MULTIVITAMIN CH
1.0000 | ORAL_TABLET | Freq: Every day | ORAL | Status: DC
  Administered 2016-11-21: 1 via ORAL
  Filled 2016-11-20 (×2): qty 1

## 2016-11-20 NOTE — Progress Notes (Signed)
Pt reported that she voided approx every 45 minutes between 2pm-6pm.

## 2016-11-20 NOTE — Progress Notes (Signed)
Patient ID: Mary Barrett, female   DOB: 28-Oct-1980, 36 y.o.   MRN: [id number] Pt seen. VS and labs and I/O reviewed. Stable, no symptoms. BP 116/67 (BP Location: Right Arm)   Pulse (!) 114   Temp 98 F (36.7 C) (Oral)   Resp 20   Ht 5\' 4"  (1.626 m)   Wt 227 lb (103 kg)   SpO2 98%   Breastfeeding? Unknown   BMI 38.96 kg/m   A&O x 3 Lungs CTA bilat CV RRR LE- minimal edema, DTR +2/+2 Lochia minimal   Baby in room, breast feeding, low BG and received Glucose gel patch and is doing well per parents. No more apnea episodes  Labs: CBC Latest Ref Rng & Units 11/20/2016 11/19/2016 11/19/2016  WBC 4.0 - 10.5 K/uL 22.2(H) 12.5(H) 11.5(H)  Hemoglobin 12.0 - 15.0 g/dL 69.612.1 29.513.5 28.414.4  Hematocrit 36.0 - 46.0 % 35.4(L) 39.4 41.7  Platelets 150 - 400 K/uL 166 164 177   CMP Latest Ref Rng & Units 11/19/2016 11/19/2016 11/19/2016  Glucose 65 - 99 mg/dL 99 83 90  BUN 6 - 20 mg/dL 13 14 15   Creatinine 0.44 - 1.00 mg/dL 1.320.86 4.400.82 1.020.80  Sodium 135 - 145 mmol/L 130(L) 134(L) 133(L)  Potassium 3.5 - 5.1 mmol/L 4.3 4.6 4.2  Chloride 101 - 111 mmol/L 99(L) 101 103  CO2 22 - 32 mmol/L 21(L) 20(L) 21(L)  Calcium 8.9 - 10.3 mg/dL 8.0(L) 9.0 9.4  Total Protein 6.5 - 8.1 g/dL 6.9 7.6 7.6  Total Bilirubin 0.3 - 1.2 mg/dL 7.2(Z1.4(H) 1.0 0.9  Alkaline Phos 38 - 126 U/L 169(H) 201(H) 194(H)  AST 15 - 41 U/L 292(H) 281(H) 241(H)  ALT 14 - 54 U/L 606(H) 618(H) 568(H)   Office Bile acids back at 83 !!!  A/P: Postpartum, SVD this AM after IOL for HELLP. But based on high bile acids, high LFT and normal Platelets and very mildly elevated BPs, normal office urine P/c ratio at 0.26, this is likely severe cholestasis of pregnancy and less likely HELLP syndrome. No e/o magnesium intolerance or toxicity.  Continue magnesium till 7 am tomorrow as tolerating well. Reviewed new labs with patient. Understands.

## 2016-11-20 NOTE — Lactation Note (Signed)
This note was copied from a baby's chart. Lactation Consultation Note  Patient Name: Mary Barrett ZOXWR'UToday's Date: 11/20/2016 Reason for consult: Follow-up assessment;Late preterm infant  LPI 5 hours old. Mom sleepy, not wanting to latch baby at this time. Assisted mom with hand expression no colostrum flowing. Assisted FOB to give 2.5 ml of EBM--at bedside--by spoon and finger, and baby tolerated well. Reviewed LPI guidelines and enc mom to offer lots of STS and nurse with cues, and at least every 3 hours. Enc supplementing with EBM/formula according to guidelines, which were reviewed. Discussed assessment and interventions with patient's bedside nurse, Raynelle FanningJulie, RN.   Maternal Data    Feeding    LATCH Score/Interventions                      Lactation Tools Discussed/Used     Consult Status Consult Status: Follow-up Date: 11/21/16 Follow-up type: In-patient    Sherlyn HayJennifer D Gustavia Carie 11/20/2016, 4:17 PM

## 2016-11-20 NOTE — Progress Notes (Signed)
Pt comfortable with epdidural, but beginning to feel discomfort in her lower abdomen No other c/o No sob/cp, h/a, vision changes BP 138/68   Pulse (!) 102   Temp 99.2 F (37.3 C) (Axillary)   Resp 18   Ht 5\' 4"  (1.626 m)   Wt 227 lb (103 kg)   SpO2 97%   BMI 38.96 kg/m   Bps: 130s-140s/60s-90s  Intake/Output Summary (Last 24 hours) at 11/20/16 0804 Last data filed at 11/20/16 [id number]  Gross per 24 hour  Intake          7087.18 ml  Output             2315 ml  Net          4772.18 ml  last few hrs pt with 125 in and 80-120 out   A&ox3 Normal respirations Abd: soft, nt, gravid Cx: c/c/+2 Le: no edema, nt bilat  Fht: category 1 Toco: irreg q 1-3 min  A/p: iup at 36 3 with HELLP 1. Contin pitocin and plan svd; begin pushing 2. Fetal status reassuring - contin efm 3. gbs results just received and neg, will d/c further pcn 4. HELLP - contin mag sulfate, plan 24 hr after delivery; repeat labs 6-12 hrs after delivery; no s/s toxicity; contin strict I/o, pt fairly matched; urine is bloody, was more amber and now blood colored urine and appears d/t trauma from foley; will follow closely pp 5. bs wnl

## 2016-11-20 NOTE — Progress Notes (Signed)
Comfortable with epidural, no c/o No h/a, vision changes, abd pain, n/v, sob  Vs: 130s-140s/80s-90s,  161/91x1, 155/99 x1 100, 18, 98.3  I/O: for last 9 hrs  In: 2025m Out: 1195  See nursing note for pt reporting I/o; pt reports voiding about every 43m from admission/starting mag sulfate until around 1900, about 4-5 times; note that these are not all recorded and therefore the 24 hr total does not accurately reflect the patients output for last 24 hrs  Intake/Output Summary (Last 24 hours) at 11/20/16 0400 Last data filed at 11/20/16 0300  Gross per 24 hour  Intake          6481.13 ml  Output             1795 ml  Net          4686.13 ml   The patient has been well balanced over last 9 hours and will continue to monitor closely for signs of overload.  Fht: category 1, 130s, nml variability, +accels, no decels Toco: now regular q 2  A&ox3 rrr ctab Abd: soft, nt, gravid Cx: (per nursing) 03/02/00/-0cephalic LE: no edema, nt bilat dtr +1  Labs: h/h 13.5, 39.4, plts 164 (stable); ast 292 (281, 241), alt 606 (618, 568)  Otherwise labs wnl, labs stable, plan recheck after delivery  A/p: iup at 36 3 with HELLP 1. Contin pitocin IOL, plan recheck in 1 hr; plan svd; position changes encouraged; bps normal to mild range, follow closely; no s/s magnesium toxicity or fluid overload; follow closely  2. Fetal status reassuring - contin efm 3. gbs unknown - contin pcn prophylaxis 4. Rubella equiv - mmr pp 5. Rh pos 6. gdma1 - bs wnl, 80s/90s

## 2016-11-20 NOTE — Progress Notes (Signed)
Delivery Note At 10:42 AM a viable female was delivered via Vaginal, Spontaneous Delivery, LOA; after delivery of the fetal head, there was delivery of the anterior shoulder but then appeared to be decreased maternal effort.  I was able to feel that shoulder was completely delivered but then no maternal effert - nursing applied suprapubic pressure (lasting 5 seconds)  to assist in delivery of chest and then abdomen delivered quickly.   APGAR: 7, 8; weight  -not yet done.   Placenta status: delivered spontaneously, intact   Anesthesia:  epidural Episiotomy: None Lacerations: Labial - bilat Suture Repair: 3.0 vicryl Est. Blood Loss (mL):  350ml  Mom to postpartum.  Baby to Couplet care / Skin to Skin.  Both stable in room.   Vick FreesSusan E Tahmid Stonehocker 11/20/2016, 11:17 AM

## 2016-11-20 NOTE — Lactation Note (Addendum)
This note was copied from a baby's chart. Lactation Consultation Note  Patient Name: Mary Leafy Halfnnmarie Tracz WUJWJ'XToday's Date: 11/20/2016 Reason for consult: Initial assessment;Late preterm infant   Initial assessment with first time mom of 1 hour old infant [facility]. Infant was put to breast after delivery and experienced a dusky episode at which time he was removed from the breast and placed under a warmer.   Maternal history of GDM-Diet controlled, HELLP Syndrome, and infant is a LPT infant.   Infant was awake and alert when LC entered room. He was rooting and cueing to feed. His pulse ox was reading 100%. Offered mom latching to the breast vs spoon feeding, she wanted to put infant to breast. Mom with large semi compressible breasts and everted nipples. She reports "a little" breast growth with pregnancy.   Assisted mom in latching infant to right breast in the laid back cross cradle hold. He latched easily with the teacup hold. He nursed actively for 15 minutes. Pulse ox remained at 100% throughout feeding. LC hand expressed colostrum and spoon fed infant 7 cc colostrum, he tolerated it well. At this time LC was notified that infant blood glucose is 27. Infant then latched to left breast for an additional 15 minutes. He then self detached and given to FOB. Doloris Hallebbie Nix, RN form CN aware of feeding. An additional 2 cc colostrum was expressed for next feeding, FOB to take to PP room.   Reviewed BF basics, positioning, hand expression, colostrum, spoon feeding, milk coming to volume. LPT infant feeding policy handout given, mom and dad were requested to feed infant at least every 3 hours and with feeding cues, Keep hat on at all times, limit stimulation to infant and to keep infant STS as much as possible. Enc mom to hand express and feed infant colostrum after every BF. Reviewed LPT infant feeding behaviors and importance of caloric intake for LPT infant.   Mom is planning to exclusive BF infant,  discussed that if colostrum is not obtainable we may have to suggest formula, mom voiced understanding and wants what is best for infant. Discussed getting mom pumping this afternoon to stimulate milk production along with hand expression.   BF Resources Handout and LC Brochure given, mom informed of IP/OP Services, BF Support Groups and LC phone #. Enc mom to call out for assistance as needed. Parents without further questions/concerns at this time.     Maternal Data Formula Feeding for Exclusion: No Has patient been taught Hand Expression?: Yes Does the patient have breastfeeding experience prior to this delivery?: No  Feeding Feeding Type: Breast Fed Length of feed: 30 min  LATCH Score/Interventions Latch: Grasps breast easily, tongue down, lips flanged, rhythmical sucking.  Audible Swallowing: Spontaneous and intermittent Intervention(s): Hand expression;Skin to skin  Type of Nipple: Everted at rest and after stimulation  Comfort (Breast/Nipple): Soft / non-tender     Hold (Positioning): Assistance needed to correctly position infant at breast and maintain latch. Intervention(s): Breastfeeding basics reviewed;Support Pillows;Position options;Skin to skin  LATCH Score: 9  Lactation Tools Discussed/Used WIC Program: No   Consult Status Consult Status: Follow-up Date: 11/21/16 Follow-up type: In-patient    Mary Barrett 11/20/2016, 1:10 PM

## 2016-11-20 NOTE — Anesthesia Postprocedure Evaluation (Signed)
Anesthesia Post Note  Patient: Mary Barrett  Procedure(s) Performed: * No procedures listed *  Patient location during evaluation: Mother Baby Anesthesia Type: Epidural Level of consciousness: awake and alert and oriented Pain management: satisfactory to patient Vital Signs Assessment: post-procedure vital signs reviewed and stable Respiratory status: spontaneous breathing and nonlabored ventilation Cardiovascular status: stable Postop Assessment: no headache, no backache, no signs of nausea or vomiting, adequate PO intake and patient able to bend at knees (patient up walking) Anesthetic complications: no        Last Vitals:  Vitals:   11/20/16 1531 11/20/16 1555  BP: 116/67   Pulse: (!) 114   Resp: 20   Temp:  36.7 C    Last Pain:  Vitals:   11/20/16 1555  TempSrc: Oral  PainSc:    Pain Goal: Patients Stated Pain Goal: 3 (11/20/16 1430)               Madison HickmanGREGORY,Celise Bazar

## 2016-11-21 LAB — COMPREHENSIVE METABOLIC PANEL
ALBUMIN: 2.2 g/dL — AB (ref 3.5–5.0)
ALK PHOS: 131 U/L — AB (ref 38–126)
ALT: 363 U/L — AB (ref 14–54)
AST: 146 U/L — AB (ref 15–41)
Anion gap: 6 (ref 5–15)
BUN: 14 mg/dL (ref 6–20)
CALCIUM: 7 mg/dL — AB (ref 8.9–10.3)
CO2: 24 mmol/L (ref 22–32)
CREATININE: 0.73 mg/dL (ref 0.44–1.00)
Chloride: 108 mmol/L (ref 101–111)
GFR calc Af Amer: >60 mL/min (ref >60)
GFR calc non Af Amer: >60 mL/min (ref >60)
GLUCOSE: 97 mg/dL (ref 65–99)
Potassium: 4 mmol/L (ref 3.5–5.1)
SODIUM: 138 mmol/L (ref 135–145)
Total Bilirubin: 0.8 mg/dL (ref 0.3–1.2)
Total Protein: 5.1 g/dL — ABNORMAL LOW (ref 6.5–8.1)

## 2016-11-21 LAB — CBC
HCT: 28.7 % — ABNORMAL LOW (ref 36.0–46.0)
HEMOGLOBIN: 9.9 g/dL — AB (ref 12.0–15.0)
MCH: 30.7 pg (ref 26.0–34.0)
MCHC: 34.5 g/dL (ref 30.0–36.0)
MCV: 89.1 fL (ref 78.0–100.0)
Platelets: 152 10*3/uL (ref 150–400)
RBC: 3.22 MIL/uL — AB (ref 3.87–5.11)
RDW: 13.9 % (ref 11.5–15.5)
WBC: 10.8 10*3/uL — AB (ref 4.0–10.5)

## 2016-11-21 NOTE — Lactation Note (Signed)
This note was copied from a baby's chart. Lactation Consultation Note  Patient Name: [name] ZOXWR'UToday's Date: 11/21/2016 Reason for consult: Follow-up assessment   With this first time mom and LPI, now 6226 hours old, and 36 4/7 weeks CGA, 3% weight loss, and weight 6 lbs 1 oz. Mom was breastfeeding her baby when I walked in the room. She and dad said it took a while to wake hkm and get him latched. He was latched deeply, with great breast movement. Mom was afraid to have his nose touching her breast, but I showed her how as long as his nose was not buried in her breast, he could breathe. I also pointed out that with her hand behind his neck and ears, he could move his head easily to take a deep breath.I explained the importacnnce  of pumping every 3 hours, to protect her milk supply. I reviewed the normal behavior of a LPI, and explained why at a day old, he was starting to get sleepy.  Mom was going to eat her lunch after feeding [name], and I asked her to call for me to assist her with pumping, after eating.    Maternal Data    Feeding Feeding Type: Breast Fed Length of feed: 15 min  LATCH Score/Interventions Latch: Repeated attempts needed to sustain latch, nipple held in mouth throughout feeding, stimulation needed to elicit sucking reflex. Intervention(s): Adjust position  Audible Swallowing: A few with stimulation     Comfort (Breast/Nipple): Soft / non-tender     Hold (Positioning): No assistance needed to correctly position infant at breast. Intervention(s): Breastfeeding basics reviewed;Support Pillows;Position options;Skin to skin     Lactation Tools Discussed/Used Pump Review: Setup, frequency, and cleaning;Milk Storage;Other (comment) (hand expression reviewed) Initiated by:: bedside RN Date initiated:: 11/20/16   Consult Status Consult Status: Follow-up Date: 11/22/16 Follow-up type: In-patient    Alfred LevinsLee, Rahm Minix Anne 11/21/2016, 1:25 PM

## 2016-11-21 NOTE — Lactation Note (Signed)
This note was copied from a baby's chart. Lactation Consultation Note  Patient Name: Mary Barrett NWGNF'AToday's Date: 11/21/2016 Reason for consult: Follow-up assessment  With this mom of a LPI, now 6528 hours old. I reviewed hand expression with mom, and shevhas easily expressed colostrum. I examined mom's breasts, and told her to try 24 flanges, after applying coconut oil. If they are too tight or cause discomfort, to increase to 27 flanges. Mom has not been pumping, despite my advise. She reports she has company coming now, and will pump later. Mom knows to call for questions/conerns.    Maternal Data    Feeding Feeding Type: Breast Fed Length of feed: 15 min  LATCH Score/Interventions Latch: Repeated attempts needed to sustain latch, nipple held in mouth throughout feeding, stimulation needed to elicit sucking reflex. Intervention(s): Adjust position  Audible Swallowing: A few with stimulation     Comfort (Breast/Nipple): Soft / non-tender     Hold (Positioning): No assistance needed to correctly position infant at breast. Intervention(s): Breastfeeding basics reviewed;Support Pillows;Position options;Skin to skin     Lactation Tools Discussed/Used Pump Review: Setup, frequency, and cleaning;Milk Storage;Other (comment) (hand expression reviewed) Initiated by:: bedside RN Date initiated:: 11/20/16   Consult Status Consult Status: Follow-up Date: 11/22/16 Follow-up type: In-patient    Alfred LevinsLee, Donte Lenzo Anne 11/21/2016, 2:55 PM

## 2016-11-21 NOTE — Progress Notes (Signed)
Dr. In and visited. Orders were received and carried. Mag was discontinued as ordered.I.V. Was converted to NSL.

## 2016-11-21 NOTE — Progress Notes (Signed)
Post Partum Day 1, SVD after IOL for HELLP syndrome but diagnosis favors severe Cholestasis of pregnancy  Subjective: no complaints, up ad lib, tolerating PO and just feels tired but overall ok   Objective: Blood pressure 102/64, pulse 78, temperature 98.5 F (36.9 C), temperature source Oral, resp. rate 20, height 5\' 4"  (1.626 m), weight 227 lb (103 kg), SpO2 98 %, unknown if currently breastfeeding.  Temp:  [98 F (36.7 C)-98.6 F (37 C)] 98.5 F (36.9 C) (05/26 0330) Pulse Rate:  [78-115] 78 (05/26 0730) Resp:  [16-20] 20 (05/26 0730) BP: (102-154)/(63-88) 102/64 (05/26 0730) SpO2:  [98 %-100 %] 98 % (05/26 0330)   Physical Exam:  General: alert and cooperative  Lungs CTA bilat. CV RRR Lochia: appropriate Uterine Fundus: firm Incision: perineum wnl, no swelling  DVT Evaluation: No evidence of DVT seen on physical exam.  DTR+2  CBC Latest Ref Rng & Units 11/21/2016 11/20/2016 11/20/2016  WBC 4.0 - 10.5 K/uL 10.8(H) 17.6(H) 22.2(H)  Hemoglobin 12.0 - 15.0 g/dL 9.1(Y9.9(L) 11.2(L) 12.1  Hematocrit 36.0 - 46.0 % 28.7(L) 32.2(L) 35.4(L)  Platelets 150 - 400 K/uL 152 166 166   CMP Latest Ref Rng & Units 11/21/2016 11/20/2016 11/19/2016  Glucose 65 - 99 mg/dL 97 782(N179(H) 99  BUN 6 - 20 mg/dL 14 14 13   Creatinine 0.44 - 1.00 mg/dL 5.620.73 1.300.95 8.650.86  Sodium 135 - 145 mmol/L 138 133(L) 130(L)  Potassium 3.5 - 5.1 mmol/L 4.0 4.1 4.3  Chloride 101 - 111 mmol/L 108 103 99(L)  CO2 22 - 32 mmol/L 24 21(L) 21(L)  Calcium 8.9 - 10.3 mg/dL 7.0(L) 7.3(L) 8.0(L)  Total Protein 6.5 - 8.1 g/dL 5.1(L) 5.7(L) 6.9  Total Bilirubin 0.3 - 1.2 mg/dL 0.8 1.1 7.8(I1.4(H)  Alkaline Phos 38 - 126 U/L 131(H) 135(H) 169(H)  AST 15 - 41 U/L 146(H) 208(H) 292(H)  ALT 14 - 54 U/L 363(H) 453(H) 606(H)   Rh positive Rub Immune   Assessment/Plan: Presumed diagnosis of HELLP Syndrome but more likely severe Cholestasis of pregnancy. Stop magensium Dc foley, IV helpock Resume PP care CMP tomorrow again. Labs better, pt  reassured  BOY, with mother, Circ planned, possibly on the day of discharge   Adaia Matthies R 11/21/2016, 11:59 AM

## 2016-11-22 LAB — COMPREHENSIVE METABOLIC PANEL
ALBUMIN: 2.4 g/dL — AB (ref 3.5–5.0)
ALT: 298 U/L — AB (ref 14–54)
AST: 116 U/L — AB (ref 15–41)
Alkaline Phosphatase: 125 U/L (ref 38–126)
Anion gap: 6 (ref 5–15)
BUN: 15 mg/dL (ref 6–20)
CHLORIDE: 108 mmol/L (ref 101–111)
CO2: 24 mmol/L (ref 22–32)
Calcium: 8 mg/dL — ABNORMAL LOW (ref 8.9–10.3)
Creatinine, Ser: 0.74 mg/dL (ref 0.44–1.00)
GFR calc Af Amer: >60 mL/min (ref >60)
GFR calc non Af Amer: >60 mL/min (ref >60)
GLUCOSE: 79 mg/dL (ref 65–99)
POTASSIUM: 4 mmol/L (ref 3.5–5.1)
Sodium: 138 mmol/L (ref 135–145)
Total Bilirubin: 0.7 mg/dL (ref 0.3–1.2)
Total Protein: 6 g/dL — ABNORMAL LOW (ref 6.5–8.1)

## 2016-11-22 MED ORDER — IBUPROFEN 600 MG PO TABS: 600.0000 mg | ORAL_TABLET | Freq: Four times a day (QID) | ORAL | 0 refills | Status: DC

## 2016-11-22 MED ORDER — ACETAMINOPHEN 325 MG PO TABS: 650.0000 mg | ORAL_TABLET | ORAL | 0 refills | Status: DC | PRN

## 2016-11-22 NOTE — Discharge Summary (Signed)
Obstetric Discharge Summary Reason for Admission: induction of labor at 36.3 wks for presumed HELLP syndrome Definitive diagnosis: Severe Cholestasis of pregnancy (due to normal urine p/c ratio and high Bile acids at 83 came back       A1GDM, AMA Prenatal Procedures: NST and ultrasound Intrapartum Procedures: spontaneous vaginal delivery and GBS prophylaxis Postpartum Procedures: magnesium sulphate for 24 hours Complications-Operative and Postpartum: 2nd degree perineal laceration   CBC Latest Ref Rng & Units 11/21/2016 11/20/2016 11/20/2016  WBC 4.0 - 10.5 K/uL 10.8(H) 17.6(H) 22.2(H)  Hemoglobin 12.0 - 15.0 g/dL 1.6(X9.9(L) 11.2(L) 12.1  Hematocrit 36.0 - 46.0 % 28.7(L) 32.2(L) 35.4(L)  Platelets 150 - 400 K/uL 152 166 166   CMP Latest Ref Rng & Units 11/22/2016 11/21/2016 11/20/2016  Glucose 65 - 99 mg/dL 79 97 096(E179(H)  BUN 6 - 20 mg/dL 15 14 14   Creatinine 0.44 - 1.00 mg/dL 4.540.74 0.980.73 1.190.95  Sodium 135 - 145 mmol/L 138 138 133(L)  Potassium 3.5 - 5.1 mmol/L 4.0 4.0 4.1  Chloride 101 - 111 mmol/L 108 108 103  CO2 22 - 32 mmol/L 24 24 21(L)  Calcium 8.9 - 10.3 mg/dL 8.0(L) 7.0(L) 7.3(L)  Total Protein 6.5 - 8.1 g/dL 6.0(L) 5.1(L) 5.7(L)  Total Bilirubin 0.3 - 1.2 mg/dL 0.7 0.8 1.1  Alkaline Phos 38 - 126 U/L 125 131(H) 135(H)  AST 15 - 41 U/L 116(H) 146(H) 208(H)  ALT 14 - 54 U/L 298(H) 363(H) 453(H)   Physical Exam:  General: alert and cooperative Lochia: appropriate Uterine Fundus: firm Incision: healing well DVT Evaluation: No evidence of DVT seen on physical exam.  Discharge Diagnoses: Term Pregnancy-delivered  Discharge Information: Date: 11/22/2016 Activity: pelvic rest Diet: routine Medications: PNV, Ibuprofen and Tylenol as needed Condition: stable, improved  Instructions: refer to practice specific booklet Discharge to: home   F/up Dr Juliene PinaMody in 1-2 wks for BP check   Newborn Data: Live born female  Birth Weight: 6 lb 4.2 oz (2841 g) APGAR: 7, 8 S/p circumcision  Home  with mother.  Mary Barrett 11/22/2016, 10:13 AM

## 2016-11-22 NOTE — Lactation Note (Addendum)
This note was copied from a baby's chart. Lactation Consultation Note  Patient Name: Mary Barrett UJWJX'BToday's Date: 11/22/2016 Reason for consult: Follow-up assessment;Late preterm infant   Follow up with mom of 48 hour old LPT infant. Infant asleep in mom's arms after circumcision. Enc mom to continue to feed infant at least every 3 hours. Enc mom to pump post BF and offer infant EBM. Mom reports she has not pumped since she is too tired. She reports she has been hand expressing and spoon feeding small amounts (1-3 ml) of colostrum after BF. Reviewed LPT infant policy and enc mom to pump post BF and offer infant EBM. Reviewed importance of calories with parents.   Mom denies s/s engorgement. Engorgement prevention/treatment, I/O and breast milk pumping, handling and storage reviewed. Mom declined making OP appt at this time, she prefers to call to make appt if needed. Enc mom to call with any questions/concerns. [facility]  DEBP rented to mom until her pump arrives. Parents without questions/concerns at this time.    Maternal Data Formula Feeding for Exclusion: No Has patient been taught Hand Expression?: Yes Does the patient have breastfeeding experience prior to this delivery?: No  Feeding    LATCH Score/Interventions                      Lactation Tools Discussed/Used WIC Program: No Pump Review: Setup, frequency, and cleaning;Milk Storage Initiated by:: Reviewed and advised   Consult Status Consult Status: Complete Follow-up type: Call as needed    Ed BlalockSharon S Jaasia Viglione 11/22/2016, 11:36 AM

## 2016-11-22 NOTE — Progress Notes (Signed)
Post Partum Day 2, Preterm SVD after IOL for HELLP syndrome but diagnosis favors severe Cholestasis of pregnancy  Subjective: No complaints. feels well.   Objective: Blood pressure 127/72, pulse 72, temperature 98.1 F (36.7 C), temperature source Oral, resp. rate 18, height 5\' 4"  (1.626 m), weight 227 lb (103 kg), SpO2 98 %, unknown if currently breastfeeding.  Temp:  [97.7 F (36.5 C)-98.6 F (37 C)] 98.1 F (36.7 C) (05/27 0913) Pulse Rate:  [72-87] 72 (05/27 0913) Resp:  [16-18] 18 (05/27 0913) BP: (93-127)/(57-83) 127/72 (05/27 0913) SpO2:  [97 %-100 %] 98 % (05/27 0913)   Physical Exam:  General: alert and cooperative  Lungs CTA bilat. CV RRR Lochia: appropriate Uterine Fundus: firm Incision: perineum wnl, no swelling  DVT Evaluation: No evidence of DVT seen on physical exam.  DTR+2  CMP Latest Ref Rng & Units 11/22/2016 11/21/2016 11/20/2016  Glucose 65 - 99 mg/dL 79 97 324(M179(H)  BUN 6 - 20 mg/dL 15 14 14   Creatinine 0.44 - 1.00 mg/dL 0.100.74 2.720.73 5.360.95  Sodium 135 - 145 mmol/L 138 138 133(L)  Potassium 3.5 - 5.1 mmol/L 4.0 4.0 4.1  Chloride 101 - 111 mmol/L 108 108 103  CO2 22 - 32 mmol/L 24 24 21(L)  Calcium 8.9 - 10.3 mg/dL 8.0(L) 7.0(L) 7.3(L)  Total Protein 6.5 - 8.1 g/dL 6.0(L) 5.1(L) 5.7(L)  Total Bilirubin 0.3 - 1.2 mg/dL 0.7 0.8 1.1  Alkaline Phos 38 - 126 U/L 125 131(H) 135(H)  AST 15 - 41 U/L 116(H) 146(H) 208(H)  ALT 14 - 54 U/L 298(H) 363(H) 453(H)    Rh positive Rub Immune   Assessment/Plan: Presumed diagnosis of HELLP Syndrome but more likely severe Cholestasis of pregnancy.  PPD #2 Labs have improved. Ok for D/c home. Fup in office with Dr Juliene PinaMody in 2 wks for BP check. Repeat CBC/CMP at 6 wks  PP care and warning s/s reviewed   BOY, with mother, Circ planned today and discharge later   Devanee Pomplun R 11/22/2016, 10:09 AM

## 2016-11-22 NOTE — Progress Notes (Signed)
Pt given discharge instructions, questions answered, pt states understanding, signed and given copy

## 2017-09-30 ENCOUNTER — Institutional Professional Consult (permissible substitution): Payer: Managed Care, Other (non HMO) | Admitting: Neurology

## 2017-11-10 ENCOUNTER — Encounter: Payer: Self-pay | Admitting: Neurology

## 2017-11-10 ENCOUNTER — Ambulatory Visit: Payer: Managed Care, Other (non HMO) | Admitting: Neurology

## 2017-11-10 VITALS — BP 148/105 | HR 109 | Ht 64.0 in | Wt 221.0 lb

## 2017-11-10 DIAGNOSIS — R351 Nocturia: Secondary | ICD-10-CM

## 2017-11-10 DIAGNOSIS — G2581 Restless legs syndrome: Secondary | ICD-10-CM

## 2017-11-10 DIAGNOSIS — G4719 Other hypersomnia: Secondary | ICD-10-CM | POA: Diagnosis not present

## 2017-11-10 DIAGNOSIS — R002 Palpitations: Secondary | ICD-10-CM | POA: Diagnosis not present

## 2017-11-10 DIAGNOSIS — F419 Anxiety disorder, unspecified: Secondary | ICD-10-CM | POA: Diagnosis not present

## 2017-11-10 DIAGNOSIS — R0683 Snoring: Secondary | ICD-10-CM | POA: Diagnosis not present

## 2017-11-10 DIAGNOSIS — E669 Obesity, unspecified: Secondary | ICD-10-CM

## 2017-11-10 NOTE — Patient Instructions (Signed)
Thank you for choosing [facility] for your sleep related care! It was nice to meet you today! I appreciate that you entrust me with your sleep related healthcare concerns. I hope, I was able to address at least some of your concerns today, and that I can help you feel reassured and also get better.    Here is what we discussed today and what we came up with as our plan for you:    Based on your symptoms and your exam I believe you are at risk for obstructive sleep apnea (aka OSA), and I think we should proceed with a sleep study to determine whether you do or do not have OSA and how severe it is. Even, if you have mild OSA, I may want you to consider treatment with CPAP, as treatment of even borderline or mild sleep apnea can result and improvement of symptoms such as sleep disruption, daytime sleepiness, nighttime bathroom breaks, restless leg symptoms, improvement of headache syndromes, even improved mood disorder.   Please remember, the long-term risks and ramifications of untreated moderate to severe obstructive sleep apnea are: increased Cardiovascular disease, including congestive heart failure, stroke, difficult to control hypertension, treatment resistant obesity, arrhythmias, especially irregular heartbeat commonly known as A. Fib. (atrial fibrillation); even type 2 diabetes has been linked to untreated OSA.   Sleep apnea can cause disruption of sleep and sleep deprivation in most cases, which, in turn, can cause recurrent headaches, problems with memory, mood, concentration, focus, and vigilance. Most people with untreated sleep apnea report excessive daytime sleepiness, which can affect their ability to drive. Please do not drive if you feel sleepy. Patients with sleep apnea developed difficulty initiating and maintaining sleep (aka insomnia).   Having sleep apnea may increase your risk for other sleep disorders, including involuntary behaviors sleep such as sleep terrors,  sleep talking, sleepwalking.    Having sleep apnea can also increase your risk for restless leg syndrome and leg movements at night.   Please note that untreated obstructive sleep apnea may carry additional perioperative morbidity. Patients with significant obstructive sleep apnea (typically, in the moderate to severe degree) should receive, if possible, perioperative PAP (positive airway pressure) therapy and the surgeons and particularly the anesthesiologists should be informed of the diagnosis and the severity of the sleep disordered breathing.   I will likely see you back after your sleep study to go over the test results and where to go from there. We will call you after your sleep study to advise about the results (most likely, you will hear from Kristen, my nurse) and to set up an appointment at the time, as necessary.    Our sleep lab administrative assistant will call you to schedule your sleep study and give you further instructions, regarding chicken process with a sleep study, arrival time, what to bring, when you can expect to leave after the study, and to answer any other logistical questions you may have. If you don't hear back from her by about 2 weeks from now, please feel free to call her direct line at [phone] or you can call our general clinic number, or email us through My Chart.   

## 2017-11-10 NOTE — Progress Notes (Signed)
Subjective:    Patient ID: Mary Barrett is a 37 y.o. female.  HPI     Huston Foley, MD, PhD [facility] [address] [address] [place], [place] [address]  Dear Dr. Link Snuffer,   I saw your patient, Mary Barrett, upon your kind request, in my neurologic clinic today for initial consultation of her sleep disorder, in particular, concern for underlying obstructive sleep apnea. The patient is unaccompanied today. As you know, Mary Barrett is a 37 year old right-handed woman with an underlying medical history of diabetes, anxiety, prior smoking, and obesity, who reports snoring and excessive daytime somnolence. She has woken up with a sense of feeling breathless and has had palpitations at night as well as increased sweating and feels anxious at night. She is supposed to start Lexapro. She is married and lives with her husband and 2 children, one stepchild, one biological. She quit smoking in January 2017. She drinks alcohol occasionally, caffeine not daily. I reviewed your office note from 09/21/2017, which you kindly included. Sadly, she recently lost her brother. Her Epworth sleepiness score is 10 out of 24, fatigue score is 37 out of 63.  She co-sleeps with her 51 month old, he sleeps fairly well, no breastfeeding. She has nocturia about once or twice per tablet night, denies morning headaches. Husband sleeps well, although he snores some but it does not seem to disturb her. She endorses restless leg symptoms which are sometimes really bothersome and sometimes less so. She is not sure if she kicks in her sleep. Bedtime varies but generally she tries to be in bed around 8 PM but is not asleep until 10 or 11. Rise time is 4 or 5 AM because her son wakes up around that time. She has a 78 year old stepdaughter as well. 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 She is currently working part-time. She tends to avoid caffeine as it makes her more  nervous. Her father has sleep apnea and uses a CPAP machine. She has had issues with nighttime anxiety since she was a teenager. She would wake up with palpitations and anxious. Since her brother passed away about 2 months ago her anxiety may have become worse. She is seeing a therapist and was given a prescription for Lexapro but was afraid to start it.  Her Past Medical History Is Significant For: Past Medical History:  Diagnosis Date  . Diabetes mellitus without complication (HCC)   . Tonsillar hypertrophy     Her Past Surgical History Is Significant For: Past Surgical History:  Procedure Laterality Date  . NO PAST SURGERIES      Her Family History Is Significant For: Family History  Problem Relation Age of Onset  . Diabetes Mother   . Hypertension Mother   . Diabetes Father   . Hypertension Father   . Cancer Maternal Grandmother   . Alcoholism Brother     Her Social History Is Significant For: Social History   Socioeconomic History  . Marital status: Married    Spouse name: Caryn Bee  . Number of children: 0  . Years of education: Not on file  . Highest education level: Not on file  Occupational History  . Not on file  Social Needs  . Financial resource strain: Not on file  . Food insecurity:    Worry: Not on file    Inability: Not on file  . Transportation needs:    Medical: Not on file    Non-medical: Not  on file  Tobacco Use  . Smoking status: Former Smoker    Last attempt to quit: 02/24/2014    Years since quitting: 3.7  . Smokeless tobacco: Never Used  Substance and Sexual Activity  . Alcohol use: No  . Drug use: No  . Sexual activity: Yes  Lifestyle  . Physical activity:    Days per week: Not on file    Minutes per session: Not on file  . Stress: Not on file  Relationships  . Social connections:    Talks on phone: Not on file    Gets together: Not on file    Attends religious service: Not on file    Active member of club or organization: Not on  file    Attends meetings of clubs or organizations: Not on file    Relationship status: Not on file  Other Topics Concern  . Not on file  Social History Narrative   Patient is married Caryn Bee) and lives at home with her husband.   Patient does not drink any caffeine.   Patient works at [employer].          Her Allergies Are:  No Known Allergies:   Her Current Medications Are:  Outpatient Encounter Medications as of 11/10/2017  Medication Sig  . escitalopram (LEXAPRO) 10 MG tablet Take 10 mg by mouth daily.  . [DISCONTINUED] acetaminophen (TYLENOL) 325 MG tablet Take 2 tablets (650 mg total) by mouth every 4 (four) hours as needed (for pain scale < 4).  . [DISCONTINUED] ibuprofen (ADVIL,MOTRIN) 600 MG tablet Take 1 tablet (600 mg total) by mouth every 6 (six) hours.  . [DISCONTINUED] omega-3 acid ethyl esters (LOVAZA) 1 g capsule Take 1 g by mouth daily.  . [DISCONTINUED] Prenatal Vit-Fe Fumarate-FA (PRENATAL MULTIVITAMIN) TABS tablet Take 1 tablet by mouth daily at 12 noon.   No facility-administered encounter medications on file as of 11/10/2017.   :  Review of Systems:  Out of a complete 14 point review of systems, all are reviewed and negative with the exception of these symptoms as listed below: Review of Systems  Neurological:       Pt presents today to discuss her sleep. Pt has never had a sleep study but does endorse snoring.  Epworth Sleepiness Scale 0= would never doze 1= slight chance of dozing 2= moderate chance of dozing 3= high chance of dozing  Sitting and reading: 2 Watching TV: 2 Sitting inactive in a public place (ex. Theater or meeting): 0 As a passenger in a car for an hour without a break: 0 Lying down to rest in the afternoon: 3 Sitting and talking to someone: 0 Sitting quietly after lunch (no alcohol): 1 In a car, while stopped in traffic: 2 Total: 10     Objective:  Neurological Exam  Physical Exam Physical Examination:   Vitals:    11/10/17 0850  BP: (!) 148/105  Pulse: (!) 109    General Examination: The patient is a very pleasant 37 y.o. female in no acute distress. She appears well-developed and well-nourished and well groomed. She is mildly anxious appearing. Upon recheck at the end of our visit her blood pressure was still in a similar range, pulse in a similar range.  HEENT: Normocephalic, atraumatic, pupils are equal, round and reactive to light and accommodation. Extraocular tracking is good without limitation to gaze excursion or nystagmus noted. Normal smooth pursuit is noted. Hearing is grossly intact. Face is symmetric with normal facial animation and normal  facial sensation. Speech is clear with no dysarthria noted. There is no hypophonia. There is no lip, neck/head, jaw or voice tremor. Neck is supple with full range of passive and active motion. There are no carotid bruits on auscultation. Oropharynx exam reveals: mild mouth dryness, good dental hygiene and marked airway crowding, due to large tonsils of nearly 4+. Mallampati is class I. Tongue protrudes centrally and palate elevates symmetrically. Neck size is 15 inches. She has a Mild overbite.   Chest: Clear to auscultation without wheezing, rhonchi or crackles noted.  Heart: S1+S2+0, regular and normal without murmurs, rubs or gallops noted.   Abdomen: Soft, non-tender and non-distended with normal bowel sounds appreciated on auscultation.  Extremities: There is no pitting edema in the distal lower extremities bilaterally.  Skin: Warm and dry without trophic changes noted.  Musculoskeletal: exam reveals no obvious joint deformities, tenderness or joint swelling or erythema.   Neurologically:  Mental status: The patient is awake, alert and oriented in all 4 spheres. Her immediate and remote memory, attention, language skills and fund of knowledge are appropriate. There is no evidence of aphasia, agnosia, apraxia or anomia. Speech is clear with normal  prosody and enunciation. Thought process is linear. Mood is normal and affect is normal.  Cranial nerves II - XII are as described above under HEENT exam. In addition: shoulder shrug is normal with equal shoulder height noted. Motor exam: Normal bulk, strength and tone is noted. There is no drift, tremor or rebound. Romberg is negative. Reflexes are 2+ throughout. Fine motor skills and coordination: intact with normal finger taps, normal hand movements, normal rapid alternating patting, normal foot taps and normal foot agility.  Cerebellar testing: No dysmetria or intention tremor on finger to nose testing. Heel to shin is unremarkable bilaterally. There is no truncal or gait ataxia.  Sensory exam: intact to light touch, pinprick, vibration, temperature sense and  proprioception in the upper and lower extremities.  Gait, station and balance: She stands easily. No veering to one side is noted. No leaning to one side is noted. Posture is age-appropriate and stance is narrow based. Gait shows normal stride length and normal pace. No problems turning are noted. Tandem walk is unremarkable.   Assessment and Plan:   In summary, Keoshia Steinmetz is a very pleasant 37 y.o.-year old female with an underlying medical history of diabetes, anxiety, prior smoking, and obesity, whose history and physical exam are concerning for obstructive sleep apnea (OSA). I had a long chat with the patient about my findings and the diagnosis of OSA, its prognosis and treatment options. We talked about medical treatments, surgical interventions and non-pharmacological approaches. I explained in particular the risks and ramifications of untreated moderate to severe OSA, especially with respect to developing cardiovascular disease down the Road, including congestive heart failure, difficult to treat hypertension, cardiac arrhythmias, or stroke. Even type 2 diabetes has, in part, been linked to untreated OSA. Symptoms of untreated OSA  include daytime sleepiness, memory problems, mood irritability and mood disorder such as depression and anxiety, lack of energy, as well as recurrent headaches, especially morning headaches. We talked about trying to maintain a healthy lifestyle in general, as well as the importance of weight control. I encouraged the patient to eat healthy, exercise daily and keep well hydrated, to keep a scheduled bedtime and wake time routine, to not skip any meals and eat healthy snacks in between meals. I advised the patient not to drive when feeling sleepy. I recommended the following  at this time: sleep study with potential positive airway pressure titration. (We will score hypopneas at 4%).   I explained the sleep test procedure to the patient and also outlined possible surgical and non-surgical treatment options of OSA, including the use of a custom-made dental device (which would require a referral to a specialist dentist or oral surgeon), upper airway surgical options, such as pillar implants, radiofrequency surgery, tongue base surgery, and UPPP (which would involve a referral to an ENT surgeon). Rarely, jaw surgery such as mandibular advancement may be considered.  I also explained the CPAP treatment option to the patient, who indicated that she would be willing to try CPAP if the need arises. I explained the importance of being compliant with PAP treatment, not only for insurance purposes but primarily to improve Her symptoms, and for the patient's long term health benefit, including to reduce Her cardiovascular risks. I answered all her questions today and the patient was in agreement. I would like to see her back after the sleep study is completed and encouraged her to call with any interim questions, concerns, problems or updates.   Thank you very much for allowing me to participate in the care of this nice patient. If I can be of any further assistance to you please do not hesitate to call me at  [phone].  Sincerely,   Huston Foley, MD, PhD

## 2017-11-23 ENCOUNTER — Telehealth: Payer: Self-pay | Admitting: Neurology

## 2017-11-23 DIAGNOSIS — G4719 Other hypersomnia: Secondary | ICD-10-CM

## 2017-11-23 NOTE — Telephone Encounter (Signed)
[employer] denied in lab study. Please place order for an hst.

## 2017-11-23 NOTE — Addendum Note (Signed)
Addended by: Geronimo Running A on: 11/23/2017 10:19 AM   Modules accepted: Orders

## 2017-11-23 NOTE — Telephone Encounter (Signed)
VO for HST from Dr. Athar received. HST order placed.  

## 2017-12-15 ENCOUNTER — Ambulatory Visit (INDEPENDENT_AMBULATORY_CARE_PROVIDER_SITE_OTHER): Payer: Managed Care, Other (non HMO) | Admitting: Neurology

## 2017-12-15 DIAGNOSIS — G4733 Obstructive sleep apnea (adult) (pediatric): Secondary | ICD-10-CM

## 2017-12-15 DIAGNOSIS — G4719 Other hypersomnia: Secondary | ICD-10-CM

## 2017-12-16 NOTE — Addendum Note (Signed)
Addended by: Huston FoleyATHAR, Anjulie Dipierro on: 12/16/2017 06:53 PM   Modules accepted: Orders

## 2017-12-16 NOTE — Progress Notes (Signed)
Patient referred by Dr. Holwerda, seen by me on 5/15/1Link Snuffer9, HST on 12/15/17.    Please call and notify the patient that the recent home sleep test showed obstructive sleep apnea. OSA is overall mild, but worth treating to see if she feels better after treatment. To that end I recommend treatment for this in the form of autoPAP, which means, that we don't have to bring her in for a sleep study with CPAP, but will let her try an autoPAP machine at home, through a DME company (of her choice, or as per insurance requirement). The DME representative will educate her on how to use the machine, how to put the mask on, etc. I have placed an order in the chart. Please send referral, talk to patient, send report to referring MD. We will need a FU in sleep clinic for 10 weeks post-PAP set up, please arrange that with me or one of our NPs.  Other OSA treatment options may include avoidance of supine sleep position along with weight loss, upper airway or jaw surgery in selected patients or the use of an oral appliance in certain patients. ENT evaluation and/or consultation with a maxillofacial surgeon or dentist may be feasible in some instances.  Huston FoleySaima Jamond Neels, MD, PhD [facility])

## 2017-12-16 NOTE — Procedures (Signed)
[facility] [address] [place], [place] [address] NAME:  Mary Barrett [name]                                                                DOB: 11-28-1980 MEDICAL RECORD NUMBER [id number]                                                       DOS:  12/15/2017 REFERRING PHYSICIAN: Alysia PennaScott Holwerda, MD STUDY PERFORMED: Home Sleep Test HISTORY: 37 year old woman with a history of diabetes, anxiety, prior smoking, and obesity, who reports snoring and excessive daytime somnolence. Her Epworth sleepiness score is 10 out of 24, BMI:37.9.   STUDY RESULTS:  Total Recording Time: 7 hours, 27 minutes (valid test time: 5 hours, 39 minutes) Total Apnea/Hypopnea Index (AHI): 7.8/h, RDI: 9.5/h Average Oxygen Saturation: 95%, Lowest Oxygen Desaturation: 88%  Total Time Oxygen Saturation Below or at 88%: 0 minutes  Average Heart Rate:   61 bpm (between 75 and 112 bpm) IMPRESSION: OSA RECOMMENDATION: This home sleep test demonstrates mild obstructive sleep apnea with a total AHI of 7.8/hour and O2 nadir of 88%. Given the patient's medical history and sleep related complaints, treatment with positive airway pressure is a reasonable choice. This can be achieved in the form of autoPAP trial/titration at home. Other OSA treatment options may include avoidance of supine sleep position along with weight loss, upper airway or jaw surgery in selected patients or the use of an oral appliance in certain patients. ENT evaluation and/or consultation with a maxillofacial surgeon or dentist may be feasible in some instances. Other causes of the patient's symptoms, including circadian rhythm disturbances, an underlying mood disorder, medication effect and/or an underlying medical problem cannot be ruled out based on this test. Clinical correlation is recommended. The patient and his referring provider will be notified of the test results. The patient [facility].  I  certify that I have reviewed the raw data recording prior to the issuance of this report in accordance with the standards of the American Academy of Sleep Medicine (AASM).  Huston FoleySaima Jenan Ellegood, MD, PhD [facility]) Diplomat, ABPN (Neurology and Sleep)

## 2017-12-17 ENCOUNTER — Telehealth: Payer: Self-pay | Admitting: Neurology

## 2017-12-17 DIAGNOSIS — R0683 Snoring: Secondary | ICD-10-CM

## 2017-12-17 DIAGNOSIS — G4719 Other hypersomnia: Secondary | ICD-10-CM

## 2017-12-17 DIAGNOSIS — G4733 Obstructive sleep apnea (adult) (pediatric): Secondary | ICD-10-CM

## 2017-12-17 NOTE — Telephone Encounter (Signed)
I called pt. I advised pt that Dr. Frances FurbishAthar reviewed their sleep study results and found that pt has mild sleep apnea. Dr. Frances FurbishAthar recommends that pt starts a auto CPAP. Some other treatment options would be avoidin supine position in which I educated on that. I discussed the oral appliance that is offered by a dentist that helps with treatment of mild apnea. Pt inquired more of the dental device. I informed her of this and how that process usually works. The pt is asking to be referred for a dental device. I informed her that I would make Dr Frances FurbishAthar aware of this and that an order could be placed for a referral to dentist to have this completed. Pt verbalized understanding of results. Pt had no questions at this time but was encouraged to call back if questions arise.

## 2017-12-17 NOTE — Telephone Encounter (Signed)
-----   Message from Huston FoleySaima Athar, MD sent at 12/16/2017  6:53 PM EDT ----- Patient referred by Dr. Link SnufferHolwerda, seen by me on 11/10/17, HST on 12/15/17.    Please call and notify the patient that the recent home sleep test showed obstructive sleep apnea. OSA is overall mild, but worth treating to see if she feels better after treatment. To that end I recommend treatment for this in the form of autoPAP, which means, that we don't have to bring her in for a sleep study with CPAP, but will let her try an autoPAP machine at home, through a DME company (of her choice, or as per insurance requirement). The DME representative will educate her on how to use the machine, how to put the mask on, etc. I have placed an order in the chart. Please send referral, talk to patient, send report to referring MD. We will need a FU in sleep clinic for 10 weeks post-PAP set up, please arrange that with me or one of our NPs.  Other OSA treatment options may include avoidance of supine sleep position along with weight loss, upper airway or jaw surgery in selected patients or the use of an oral appliance in certain patients. ENT evaluation and/or consultation with a maxillofacial surgeon or dentist may be feasible in some instances.  Huston FoleySaima Athar, MD, PhD [facility])

## 2018-01-03 NOTE — Telephone Encounter (Signed)
Referral to Dr. Irene LimboSandra Fuller, dentistry, for oral appl for OSA placed in chart. Please send my notes and sleep study results as well as tech data with referral, thanks.  Huston FoleySaima Orman Matsumura, MD, PhD [facility])

## 2018-01-03 NOTE — Addendum Note (Signed)
Addended by: Huston FoleyATHAR, Armend Hochstatter on: 01/03/2018 10:50 AM   Modules accepted: Orders

## 2018-01-04 NOTE — Telephone Encounter (Signed)
Called and left patient a message telephone [phone] fax [phone]  Referral has been faxed sleep study attached.

## 2019-02-08 ENCOUNTER — Other Ambulatory Visit: Payer: Self-pay | Admitting: Internal Medicine

## 2019-02-08 DIAGNOSIS — Z8249 Family history of ischemic heart disease and other diseases of the circulatory system: Secondary | ICD-10-CM

## 2019-04-18 ENCOUNTER — Other Ambulatory Visit: Payer: Self-pay

## 2019-04-18 ENCOUNTER — Ambulatory Visit (HOSPITAL_COMMUNITY)
Admission: EM | Admit: 2019-04-18 | Discharge: 2019-04-18 | Disposition: A | Discharge status: Home / Self Care | Payer: Managed Care, Other (non HMO) | Attending: Urgent Care | Admitting: Urgent Care

## 2019-04-18 ENCOUNTER — Encounter (HOSPITAL_COMMUNITY): Payer: Self-pay

## 2019-04-18 DIAGNOSIS — S161XXA Strain of muscle, fascia and tendon at neck level, initial encounter: Secondary | ICD-10-CM

## 2019-04-18 DIAGNOSIS — M542 Cervicalgia: Secondary | ICD-10-CM

## 2019-04-18 MED ORDER — NAPROXEN 500 MG PO TABS: 500.0000 mg | ORAL_TABLET | Freq: Two times a day (BID) | ORAL | 0 refills | Status: DC

## 2019-04-18 MED ORDER — CYCLOBENZAPRINE HCL 5 MG PO TABS: 5.0000 mg | ORAL_TABLET | Freq: Three times a day (TID) | ORAL | 0 refills | Status: DC | PRN
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
## 2019-11-28 ENCOUNTER — Other Ambulatory Visit: Payer: Self-pay

## 2019-11-28 ENCOUNTER — Emergency Department (HOSPITAL_COMMUNITY): Payer: Managed Care, Other (non HMO)

## 2019-11-28 ENCOUNTER — Emergency Department (HOSPITAL_COMMUNITY)
Admission: EM | Admit: 2019-11-28 | Discharge: 2019-11-28 | Disposition: A | Discharge status: Home / Self Care | Payer: Managed Care, Other (non HMO) | Attending: Emergency Medicine | Admitting: Emergency Medicine

## 2019-11-28 ENCOUNTER — Encounter (HOSPITAL_COMMUNITY): Payer: Self-pay | Admitting: Emergency Medicine

## 2019-11-28 DIAGNOSIS — Y92410 Unspecified street and highway as the place of occurrence of the external cause: Secondary | ICD-10-CM | POA: Diagnosis not present

## 2019-11-28 DIAGNOSIS — Z87891 Personal history of nicotine dependence: Secondary | ICD-10-CM | POA: Insufficient documentation

## 2019-11-28 DIAGNOSIS — M791 Myalgia, unspecified site: Secondary | ICD-10-CM | POA: Diagnosis present

## 2019-11-28 DIAGNOSIS — Y93I9 Activity, other involving external motion: Secondary | ICD-10-CM | POA: Insufficient documentation

## 2019-11-28 DIAGNOSIS — Z79899 Other long term (current) drug therapy: Secondary | ICD-10-CM | POA: Insufficient documentation

## 2019-11-28 DIAGNOSIS — Z7984 Long term (current) use of oral hypoglycemic drugs: Secondary | ICD-10-CM | POA: Insufficient documentation

## 2019-11-28 DIAGNOSIS — Y999 Unspecified external cause status: Secondary | ICD-10-CM | POA: Diagnosis not present

## 2019-11-28 DIAGNOSIS — M7918 Myalgia, other site: Secondary | ICD-10-CM

## 2019-11-28 DIAGNOSIS — E119 Type 2 diabetes mellitus without complications: Secondary | ICD-10-CM | POA: Insufficient documentation
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
## 2019-11-28 NOTE — Discharge Instructions (Signed)
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
## 2021-08-28 ENCOUNTER — Ambulatory Visit: Payer: Managed Care, Other (non HMO) | Admitting: Neurology

## 2021-08-28 ENCOUNTER — Encounter: Payer: Self-pay | Admitting: Neurology

## 2021-08-28 ENCOUNTER — Other Ambulatory Visit: Payer: Self-pay

## 2021-08-28 ENCOUNTER — Encounter: Payer: Self-pay | Admitting: *Deleted

## 2021-08-28 VITALS — BP 125/84 | HR 97 | Ht 64.0 in | Wt 208.0 lb

## 2021-08-28 DIAGNOSIS — E669 Obesity, unspecified: Secondary | ICD-10-CM

## 2021-08-28 DIAGNOSIS — G4733 Obstructive sleep apnea (adult) (pediatric): Secondary | ICD-10-CM | POA: Diagnosis not present

## 2021-08-28 DIAGNOSIS — F419 Anxiety disorder, unspecified: Secondary | ICD-10-CM | POA: Diagnosis not present

## 2021-08-28 DIAGNOSIS — R002 Palpitations: Secondary | ICD-10-CM

## 2021-08-28 DIAGNOSIS — R519 Headache, unspecified: Secondary | ICD-10-CM

## 2021-08-28 DIAGNOSIS — J351 Hypertrophy of tonsils: Secondary | ICD-10-CM

## 2021-08-28 DIAGNOSIS — R634 Abnormal weight loss: Secondary | ICD-10-CM | POA: Diagnosis not present

## 2021-08-28 DIAGNOSIS — R351 Nocturia: Secondary | ICD-10-CM

## 2021-08-28 NOTE — Progress Notes (Signed)
Subjective:    Patient ID: Mary Barrett is a 41 y.o. female.  HPI    Huston Foley, MD, PhD [facility] [address] [address] [place], [place] [address]  Dear Dr. Link Snuffer,  I saw your patient, Mary Barrett, upon your kind request in my sleep clinic today for reevaluation of her obstructive sleep apnea.  The patient is unaccompanied today.  As you know, Mary Barrett is a 41 year old right-handed woman with an underlying medical history of diabetes, hyperlipidemia, anxiety and obesity, who was previously diagnosed with overall mild obstructive sleep apnea.  I had evaluated her a few years ago for this sleep apnea.  She had a home sleep test on 12/15/2017 which showed an AHI of 7.8/h, O2 nadir 88%.  She requested to pursue treatment with a dental device and we referred her to Dr. Irene Limbo.  She reports that she never pursued the dental device as it was not covered by her insurance.  She has lost quite a bit of weight after she was diagnosed with diabetes in the spring of last year.  She had an A1c that was above 12 at the time and now it is less than 6.  She still has trouble sleeping, she wakes up in a panic.  She has woken up with palpitations and sometimes wakes up with a headache.  She has nocturia about once or twice per average night.  Her dad has sleep apnea.  I reviewed your office note from 05/13/2021.  Her Epworth sleepiness score is 7 out of 24, fatigue severity score is 19 out of 63.  She had a recent strep throat infection and was on antibiotics.  When she was younger, she had recurrent tonsillitis.  She lives with her husband and her son.  She has variable work hours, she works as a Teacher, adult education.  She quit smoking in 2017 and drinks very little alcohol in the form of 1 soda per day on average, she quit drinking alcohol in May 2022 after her diabetes diagnosis.  Bedtime is generally around 7 but she does not fall asleep until around 10 PM.   Rise time is around 6 AM.  She has had anxiety at night, she has a prescription for Lexapro but did not end up taking it.  She is wondering if her anxiety is due to underlying sleep apnea.   Previously:   11/10/17: 41 year old right-handed woman with an underlying medical history of diabetes, anxiety, prior smoking, and obesity, who reports snoring and excessive daytime somnolence. She has woken up with a sense of feeling breathless and has had palpitations at night as well as increased sweating and feels anxious at night. She is supposed to start Lexapro. She is married and lives with her husband and 2 children, one stepchild, one biological. She quit smoking in January 2017. She drinks alcohol occasionally, caffeine not daily. I reviewed your office note from 09/21/2017, which you kindly included. Sadly, she recently lost her brother. Her Epworth sleepiness score is 10 out of 24, fatigue score is 37 out of 63.  She co-sleeps with her 72 month old, he sleeps fairly well, no breastfeeding. She has nocturia about once or twice per tablet night, denies morning headaches. Husband sleeps well, although he snores some but it does not seem to disturb her. She endorses restless leg symptoms which are sometimes really bothersome and sometimes less so. She is not sure if she kicks in her sleep. Bedtime varies but generally she  tries to be in bed around 8 PM but is not asleep until 10 or 11. Rise time is 4 or 5 AM because her son wakes up around that time. She has a 52 year old stepdaughter as well. 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 She is currently working part-time. She tends to avoid caffeine as it makes her more nervous. Her father has sleep apnea and uses a CPAP machine. She has had issues with nighttime anxiety since she was a teenager. She would wake up with palpitations and anxious. Since her brother passed away about 2 months ago her anxiety may have become worse. She is seeing a  therapist and was given a prescription for Lexapro but was afraid to start it.  Her Past Medical History Is Significant For: Past Medical History:  Diagnosis Date   COVID    Diabetes mellitus ([facility])    insulin start May 2022 A1C >12   Diabetes mellitus without complication ([facility])    Gestational diabetes    HLD (hyperlipidemia)    Tonsillar hypertrophy     Her Past Surgical History Is Significant For: Past Surgical History:  Procedure Laterality Date   NO PAST SURGERIES      Her Family History Is Significant For: Family History  Problem Relation Age of Onset   Diabetes Mother    Hypertension Mother    Diabetes Father    Hypertension Father    CAD Father        CABG around 32   Alcoholism Brother    Dilated cardiomyopathy Brother    Schizophrenia Brother    Cancer Maternal Grandmother     Her Social History Is Significant For: Social History   Socioeconomic History   Marital status: Married    Spouse name: Caryn Bee   Number of children: 0   Years of education: Not on file   Highest education level: Not on file  Occupational History   Not on file  Tobacco Use   Smoking status: Former    Types: Cigarettes    Quit date: 02/24/2014    Years since quitting: 7.5   Smokeless tobacco: Never  Vaping Use   Vaping Use: Never used  Substance and Sexual Activity   Alcohol use: No    Comment: quit since 10/2020   Drug use: No   Sexual activity: Yes  Other Topics Concern   Not on file  Social History Narrative   Patient is married Caryn Bee) and lives at home with her husband.Patient does not drink any caffeine.Patient works at [employer].     Social Determinants of Health   Financial Resource Strain: Not on file  Food Insecurity: Not on file  Transportation Needs: Not on file  Physical Activity: Not on file  Stress: Not on file  Social Connections: Not on file    Her Allergies Are:  No Known Allergies:   Her Current Medications Are:  Outpatient Encounter  Medications as of 08/28/2021  Medication Sig   amoxicillin (AMOXIL) 875 MG tablet Take 875 mg by mouth 2 (two) times daily.   Ascorbic Acid (VITAMIN C) 1000 MG tablet Take 1,000 mg by mouth daily. (Patient not taking: Reported on 08/28/2021)   folic acid (FOLVITE) 400 MCG tablet Take 400 mcg by mouth daily. (Patient not taking: Reported on 08/28/2021)   [DISCONTINUED] cyclobenzaprine (FLEXERIL) 5 MG tablet Take 1 tablet (5 mg total) by mouth 3 (three) times daily as needed for muscle spasms.   [DISCONTINUED] escitalopram (LEXAPRO) 10  MG tablet Take 10 mg by mouth daily.   [DISCONTINUED] insulin degludec (TRESIBA FLEXTOUCH) 100 UNIT/ML FlexTouch Pen Inject 6 Units into the skin daily.   [DISCONTINUED] naproxen (NAPROSYN) 500 MG tablet Take 1 tablet (500 mg total) by mouth 2 (two) times daily.   No facility-administered encounter medications on file as of 08/28/2021.  :   Review of Systems:  Out of a complete 14 point review of systems, all are reviewed and negative with the exception of these symptoms as listed below:  Review of Systems  Neurological:        Wants to start process again, last SS 2019. Snoring (mild), wakes up panicky.  Did not f/thru with dental device (insurance not cover).  Now diagnosed with diabetes. ESS 7, FSS 19.   Objective:  Neurological Exam  Physical Exam Physical Examination:   Vitals:   08/28/21 1054  BP: 125/84  Pulse: 97    General Examination: The patient is a very pleasant 41 y.o. female in no acute distress. She appears well-developed and well-nourished and well groomed.   HEENT: Normocephalic, atraumatic, pupils are equal, round and reactive to light, extraocular tracking is good without limitation to gaze excursion or nystagmus noted. Normal smooth pursuit is noted. Hearing is grossly intact. Face is symmetric with normal facial animation. Speech is clear with no dysarthria noted. There is no hypophonia. There is no lip, neck/head, jaw or voice tremor.  Neck is supple with full range of passive and active motion. There are no carotid bruits on auscultation. Oropharynx exam reveals: mild mouth dryness, good dental hygiene and airway crowding, due to tonsils of nearly about 3+. Mallampati is class I. Tongue protrudes centrally and palate elevates symmetrically.    Chest: Clear to auscultation without wheezing, rhonchi or crackles noted.   Heart: S1+S2+0, regular and normal without murmurs, rubs or gallops noted.    Abdomen: Soft, non-tender and non-distended.   Extremities: There is no pitting edema in the distal lower extremities bilaterally.   Skin: Warm and dry without trophic changes noted.   Musculoskeletal: exam reveals no obvious joint deformities.    Neurologically:  Mental status: The patient is awake, alert and oriented in all 4 spheres. Her immediate and remote memory, attention, language skills and fund of knowledge are appropriate. There is no evidence of aphasia, agnosia, apraxia or anomia. Speech is clear with normal prosody and enunciation. Thought process is linear. Mood is normal and affect is normal.  Cranial nerves II - XII are as described above under HEENT exam.  Motor exam: Normal bulk, strength and tone is noted. There is no tremor. Fine motor skills and coordination: grossly intact.  Cerebellar testing: No dysmetria or intention tremor. There is no truncal or gait ataxia.  Sensory exam: intact to light touch.  Gait, station and balance: She stands easily. No veering to one side is noted. No leaning to one side is noted. Posture is age-appropriate and stance is narrow based. Gait shows normal stride length and normal pace. No problems turning are noted.    Assessment and Plan:    In summary, Julya Alioto is a very pleasant 41 y.o.-year old female with an underlying medical history of diabetes, hyperlipidemia, anxiety and obesity, who presents for reevaluation of her obstructive sleep apnea.  She was previously  diagnosed with obstructive sleep apnea but never pursued treatment.  She has been working on weight loss especially since she was diagnosed with diabetes last year.  She has significant sleep related symptoms and  would benefit from reevaluation with repeat testing.   I had a long chat with the patient about my findings and the diagnosis of OSA, its prognosis and treatment options. We talked about medical treatments, surgical interventions and non-pharmacological approaches. I explained in particular the risks and ramifications of untreated moderate to severe OSA, especially with respect to developing cardiovascular disease down the Road, including congestive heart failure, difficult to treat hypertension, cardiac arrhythmias, or stroke. Even type 2 diabetes has, in part, been linked to untreated OSA. Symptoms of untreated OSA include daytime sleepiness, memory problems, mood irritability and mood disorder such as depression and anxiety, lack of energy, as well as recurrent headaches, especially morning headaches. We talked about trying to maintain a healthy lifestyle in general, as well as the importance of weight control. We also talked about the importance of good sleep hygiene. I recommended the following at this time: sleep study. I outlined the differences between a laboratory attended sleep study versus home sleep test. I explained the sleep test procedure to the patient and also outlined possible surgical and non-surgical treatment options of OSA, including the use of a custom-made dental device (which would require a referral to a specialist dentist or oral surgeon), upper airway surgical options, such as traditional UPPP or a novel less invasive surgical option in the form of Inspire hypoglossal nerve stimulation (which would involve a referral to an ENT surgeon). I also explained the CPAP treatment option to the patient, who indicated that she would be willing to try CPAP if the need arises. I explained  the importance of being compliant with PAP treatment, not only for insurance purposes but primarily to improve Her symptoms, and for the patient's long term health benefit, including to reduce Her cardiovascular risks.  We will consider ENT referral after her sleep study results are in. I answered all her questions today and the patient was in agreement. I plan to see her back after the sleep study is completed and encouraged her to call with any interim questions, concerns, problems or updates.   Thank you very much for allowing me to participate in the care of this nice patient. If I can be of any further assistance to you please do not hesitate to call me at [phone].  Sincerely,   Huston FoleySaima Tirth Cothron, MD, PhD

## 2021-08-28 NOTE — Patient Instructions (Signed)
It was nice to see you again today!  ? ?Here is what we discussed today:  ?  ?Based on your symptoms and your exam I believe you are still at risk for obstructive sleep apnea (aka OSA), and I think we should proceed with a sleep study to determine whether you do or do not have OSA and how severe it is. Even, if you have mild OSA, I may want you to consider treatment with CPAP, as treatment of even borderline or mild sleep apnea can result and improvement of symptoms such as sleep disruption, daytime sleepiness, nighttime bathroom breaks, restless leg symptoms, improvement of headache syndromes, even improved mood disorder.  ? ?As explained, an attended sleep study (meaning you get to stay overnight in the sleep lab), lets [place] monitor sleep-related behaviors such as sleep talking and leg movements in sleep, in addition to monitoring for sleep apnea.  A home sleep test is a screening tool for sleep apnea diagnosis only, but unfortunately, does not help with any other sleep-related diagnoses. ? ?Please remember, the long-term risks and ramifications of untreated moderate to severe obstructive sleep apnea may include (but are not limited to): increased risk for cardiovascular disease, including congestive heart failure, stroke, difficult to control hypertension, treatment resistant obesity, arrhythmias, especially irregular heartbeat commonly known as A. Fib. (atrial fibrillation); even type 2 diabetes has been linked to untreated OSA.  ?Other correlations that untreated obstructive sleep apnea include macular edema which is swelling of the retina in the eyes, droopy eyelid syndrome, and elevated hemoglobin and hematocrit levels (often referred to as polycythemia). ? ?Sleep apnea can cause disruption of sleep and sleep deprivation in most cases, which, in turn, can cause recurrent headaches, problems with memory, mood, concentration, focus, and vigilance. Most people with untreated sleep apnea report excessive daytime  sleepiness, which can affect their ability to drive. Please do not drive if you feel sleepy. Patients with sleep apnea can also develop difficulty initiating and maintaining sleep (aka insomnia).  ? ?Having sleep apnea may increase your risk for other sleep disorders, including involuntary behaviors sleep such as sleep terrors, sleep talking, sleepwalking.   ? ?Having sleep apnea can also increase your risk for restless leg syndrome and leg movements at night.  ? ?Please note that untreated obstructive sleep apnea may carry additional perioperative morbidity. Patients with significant obstructive sleep apnea (typically, in the moderate to severe degree) should receive, if possible, perioperative PAP (positive airway pressure) therapy and the surgeons and particularly the anesthesiologists should be informed of the diagnosis and the severity of the sleep disordered breathing.  ? ?I will likely see you back after your sleep study to go over the test results and where to go from there. We will call you after your sleep study to advise about the results (most likely, you will hear from [facility], my nurse) and to set up an appointment at the time, as necessary.   ? ?Our sleep lab administrative assistant will call you to schedule your sleep study and give you further instructions, regarding the check in process for the sleep study, arrival time, what to bring, when you can expect to leave after the study, etc., and to answer any other logistical questions you may have. If you don't hear back from her by about 2 weeks from now, please feel free to call her direct line at [phone] or you can call our general clinic number, or email [place] through My Chart.  ? ?

## 2021-09-15 ENCOUNTER — Ambulatory Visit (INDEPENDENT_AMBULATORY_CARE_PROVIDER_SITE_OTHER): Payer: Managed Care, Other (non HMO) | Admitting: Neurology

## 2021-09-15 DIAGNOSIS — R634 Abnormal weight loss: Secondary | ICD-10-CM

## 2021-09-15 DIAGNOSIS — J351 Hypertrophy of tonsils: Secondary | ICD-10-CM

## 2021-09-15 DIAGNOSIS — G4733 Obstructive sleep apnea (adult) (pediatric): Secondary | ICD-10-CM

## 2021-09-15 DIAGNOSIS — R519 Headache, unspecified: Secondary | ICD-10-CM

## 2021-09-15 DIAGNOSIS — F419 Anxiety disorder, unspecified: Secondary | ICD-10-CM

## 2021-09-15 DIAGNOSIS — R002 Palpitations: Secondary | ICD-10-CM

## 2021-09-15 DIAGNOSIS — R351 Nocturia: Secondary | ICD-10-CM

## 2021-09-15 DIAGNOSIS — E669 Obesity, unspecified: Secondary | ICD-10-CM

## 2021-09-16 ENCOUNTER — Institutional Professional Consult (permissible substitution): Payer: Managed Care, Other (non HMO) | Admitting: Neurology

## 2021-09-17 NOTE — Progress Notes (Signed)
See procedure note.

## 2021-09-23 NOTE — Procedures (Signed)
? ?  [facility] ? ?HOME SLEEP TEST (Watch PAT) REPORT ? ?STUDY DATE: 09/15/2021 ? ?DOB: [birth year] ? ?MRN: [id number] ? ?ORDERING CLINICIAN: Huston Foley, MD, PhD ?  ?REFERRING CLINICIAN: Alysia Penna, MD  ? ?CLINICAL INFORMATION/HISTORY: 41 year old right-handed woman with an underlying medical history of diabetes, hyperlipidemia, anxiety and obesity, who was previously diagnosed with overall mild obstructive sleep apnea.  She presents for reevaluation. ? ?Epworth sleepiness score: 7/24. ? ?BMI: 35.4 kg/m? ? ?FINDINGS:  ? ?Sleep Summary:  ? ?Total Recording Time (hours, min): 7 hours, 41 minutes ? ?Total Sleep Time (hours, min):  5 hours, 52 minutes  ? ?Percent REM (%):    15.9%  ? ?Respiratory Indices:  ? ?Calculated pAHI (per hour):  6/hour        ? ?REM pAHI:    3.2/hour      ? ?NREM pAHI: 6.5/hour ? ?Oxygen Saturation Statistics:  ?  ?Oxygen Saturation (%) Mean: 94%  ? ?Minimum oxygen saturation (%):                 82% (72% on technical report appears to be erroneous to my review) ? ?O2 Saturation Range (%): 82-98%   ? ?O2 Saturation (minutes) <=88%: 0.1 min ? ?Pulse Rate Statistics:  ? ?Pulse Mean (bpm):    66/min   ? ?Pulse Range (49-114/min)  ? ?IMPRESSION: OSA (obstructive sleep apnea), ? ?RECOMMENDATION:  ?This HST shows borderline obstructive sleep apnea, with an AHI of 6/hour and O2 nadir of 82 % briefly.  Intermittent mild to moderate snoring was detected. Treatment with positive airway pressure can be considered with autoPAP, if desired by patient. Treatment options otherwise include weight loss and avoidance of the supine sleep position or a dental device. These different avenues will be discussed with the patient. The patient will be seen in follow up in sleep clinic, if necessary. Please note, that other causes of the patient's symptoms, including circadian rhythm disturbances, an underlying mood disorder, medication effect and/or an underlying medical problem cannot be ruled out  based on this test. Clinical correlation is recommended. The patient should be cautioned not to drive, work at heights, or operate dangerous or heavy equipment when tired or sleepy. Review and reiteration of good sleep hygiene measures should be pursued with any patient. The referring provider will be notified of the test results.  ? ?I certify that I have reviewed the raw data recording prior to the issuance of this report in accordance with the standards of the American Academy of Sleep Medicine (AASM). ? ? ?INTERPRETING PHYSICIAN:  ? ?Huston Foley, MD, PhD  ?Board Certified in Neurology and Sleep Medicine ? ?[facility] ?[address], [address] ?[place], [place] [address] ?[phone] ? ? ? ? ? ? ? ? ? ? ? ? ? ? ? ? ? ? ? ? ? ? ? ? ? ? ?

## 2021-09-24 ENCOUNTER — Telehealth: Payer: Self-pay | Admitting: *Deleted

## 2021-09-24 ENCOUNTER — Encounter: Payer: Self-pay | Admitting: *Deleted

## 2021-09-24 DIAGNOSIS — G4733 Obstructive sleep apnea (adult) (pediatric): Secondary | ICD-10-CM

## 2021-09-24 NOTE — Telephone Encounter (Addendum)
I called pt and gave her the message per Dr. Frances Furbish that she placed order for autopap for mild OSA, (insurance may not over for Mild OSA) but can try.  Will use [facility].  I gave her #.  They will authorize thru insurance and let her know.   I will send letter to her with information.  She is to call back if any questions.  Insurance compliance will be important to keep appt 12-08-2021 at 0930 with MM/NP. She verbalized understanding.  Letter to pt. Orders sent to [facility] [phone] with fax confirmation received.  ?

## 2021-09-24 NOTE — Telephone Encounter (Signed)
I called the pt and discussed the sleep study results. Pt understands overall she has mild to borderline obstructive sleep apnea. Options for treatment include AutoPap machine, oral appliance and may also include losing weight and avoiding sleeping on her back.  We discussed that it is our understanding she previously had a consultation with dentistry and found that the dental device was not covered by her insurance.  We are also not sure if the AutoPap machine would be covered by insurance because of her mild sleep apnea however we are happy to place the order to a DME company if she would like to proceed.  The patient's questions were answered and she stated she would think about it and let us know.  She verbalized appreciation for the call. ?

## 2021-09-24 NOTE — Addendum Note (Signed)
Addended by: Star Age on: 09/24/2021 11:51 AM ? ? Modules accepted: Orders ? ?

## 2021-09-24 NOTE — Telephone Encounter (Signed)
-----   Message from Huston Foley, MD sent at 09/23/2021  4:46 PM EDT ----- ?Patient was referred by primary care for reevaluation of her mild sleep apnea.  I saw her on 08/28/2021 and she had a home sleep test on 09/15/2021 which indicated rather mild sleep apnea with an AHI of 6/h, O2 nadir briefly at 82%.  Please advise patient that she has mild to borderline sleep apnea.  Treatment is optional, I do not mind prescribing an AutoPap machine.  Would like to try one at this point.  She may be a candidate for an oral appliance but is understand she previously had a consultation with dentistry and the oral appliance was not covered by her insurance.  Given that she does not have significant sleep apnea, it is possible that her AutoPap may not be covered by her insurance but if she would like to look into that, I can send an order to her DME company and we can proceed from there.  I have not placed an order yet, please let me know how she would like to proceed.  Treatment options for mild sleep apnea may also include losing weight and avoiding sleeping on the back. ?

## 2021-09-24 NOTE — Telephone Encounter (Signed)
I am happy to Rx an autoPAP at this point if she prefers, as I recall, she was originally in favor of using a dental device.  It is possible that your insurance may not cover an AutoPap at this time because of mild sleep apnea, we can submit through a DME company and see what they say.  Order for AutoPap placed. ?

## 2021-12-03 NOTE — Telephone Encounter (Signed)
I called pt and LMVM for her to call [facility] (whom we sent order to).  If back in March authorization was done the deductible was $300-400 per insurance.  I told her that by VM that she can call [facility] [phone] to see if amt has changed since March. ([facility] dme is whom I have we sent the order to).  She is to call back if questions.

## 2021-12-03 NOTE — Telephone Encounter (Signed)
Pt called needing to discuss with RN about the mask fitting. Pt states that they were trying to charge her $400 just to be fitted. Pt would like to clarify that and to see if she can get it from somewhere else that does not charge for the fitting or at least not that much. Please advise.

## 2021-12-08 ENCOUNTER — Encounter: Payer: Managed Care, Other (non HMO) | Admitting: Adult Health

## 2022-06-03 ENCOUNTER — Telehealth: Payer: Self-pay | Admitting: Neurology

## 2022-06-03 DIAGNOSIS — G4733 Obstructive sleep apnea (adult) (pediatric): Secondary | ICD-10-CM

## 2022-06-03 NOTE — Telephone Encounter (Signed)
Excellent, prescription placed again for cpap. Will send to [facility] once MD signature obtained.

## 2022-06-03 NOTE — Addendum Note (Signed)
Addended by: Bertram Savin on: 06/03/2022 03:16 PM   Modules accepted: Orders

## 2022-06-03 NOTE — Telephone Encounter (Signed)
AutoPAP order signed. 

## 2022-06-03 NOTE — Telephone Encounter (Signed)
Pt states she has recently spoke with DME and is able to have a payment plan for the fitting of her CPAP.  Pt states she is being told that the DME needs a new order, please reply.

## 2022-06-03 NOTE — Telephone Encounter (Signed)
Order, demographics, insurance info, office note, and sleep study all faxed to [facility]. Received a receipt of confirmation.  Please call patient and let her know we have faxed the autopap order to [facility]. They will call her within a few days to get setup on the machine. Please remind her that insurance requires her to use the machine at least 4 hours at night (goal is all night long) and she must be seen in our office for initial follow-up between 30 and 90 days after setup. Go ahead and schedule her for the follow-up approximately 80 days from today. Let us know if she has any questions. Thank you!

## 2022-06-04 NOTE — Telephone Encounter (Signed)
Called pt left vm message to call office.

## 2022-06-04 NOTE — Telephone Encounter (Addendum)
Pt called back. She was schedule for her initial appointment on 4/4 @ 9:45 am with Dr. Frances Furbish. I also  read pt message from nurse [facility] Please call patient and let her know we have faxed the autopap order to [facility]. They will call her within a few days to get setup on the machine. Please remind her that insurance requires her to use the machine at least 4 hours at night (goal is all night long) and she must be seen in our office for initial follow-up between 30 and 90 days after setup. Pt said she understand and she was informed to bring machine and power cord to appointment.

## 2022-07-09 DIAGNOSIS — R002 Palpitations: Secondary | ICD-10-CM | POA: Insufficient documentation

## 2022-07-09 NOTE — Progress Notes (Signed)
Patient referred by Velna Hatchet, MD for palpitations  Subjective:   Mary Barrett, female    DOB: 1981/02/02, 42 y.o.   MRN: [id number]   Chief Complaint  Patient presents with   Palpitations   New Patient (Initial Visit)     HPI  42 y.o. Caucasian female with h/o type 2 diabetes mellitus, family history of early CAD, referred for palpitations.  Patient works as a Geophysicist/field seismologist.  She has noticed frequent episodes of palpitations that last for seconds or 2 at a time, multiple times a day, not associated with chest pain, shortness of breath, lightheadedness or any other symptoms.  She does have family history of CAD as well as cardiomyopathy.  Her father had bypass surgery at age 42.  Her brother died at age 31 with sudden death, autopsy showing dilated cardiomyopathy, possibly related to alcohol.  Patient herself does not drink alcohol, does not drink caffeine regularly.  She was diagnosed with type 2 diabetes mellitus with A1c of around 12 and was started on insulin.  However, patient was able to come off insulin with rigorous diet and lifestyle modifications and weight loss.  Since then, she has regained some weight back, but is trying to get back into regular physical activity.   Past Medical History:  Diagnosis Date   COVID    Diabetes mellitus ([place])    insulin start May 2022 A1C >12   Diabetes mellitus without complication (HCC)    Gestational diabetes    HLD (hyperlipidemia)    Tonsillar hypertrophy      Past Surgical History:  Procedure Laterality Date   NO PAST SURGERIES       Social History   Tobacco Use  Smoking Status Former   Types: Cigarettes   Quit date: 02/24/2014   Years since quitting: 8.3  Smokeless Tobacco Never    Social History   Substance and Sexual Activity  Alcohol Use No   Comment: quit since 10/2020     Family History  Problem Relation Age of Onset   Diabetes Mother    Hypertension Mother    Diabetes Father     Hypertension Father    CAD Father        CABG around 90   Alcoholism Brother    Dilated cardiomyopathy Brother    Schizophrenia Brother    Cancer Maternal Grandmother       Current Outpatient Medications:    amoxicillin (AMOXIL) 875 MG tablet, Take 875 mg by mouth 2 (two) times daily., Disp: , Rfl:    Ascorbic Acid (VITAMIN C) 1000 MG tablet, Take 1,000 mg by mouth daily. (Patient not taking: Reported on 08/28/2021), Disp: , Rfl:    folic acid (FOLVITE) 244 MCG tablet, Take 400 mcg by mouth daily. (Patient not taking: Reported on 08/28/2021), Disp: , Rfl:    Cardiovascular and other pertinent studies:  Reviewed external labs and tests, independently interpreted  EKG 07/10/2022: Sinus tachycardia 102 bpm Othwrwise normal EKG   Recent labs: 04/2022: HbA1C 6.1% Chol 166, TG 77, HDL 45  10/2021: Glucose 117  04/2021: BUN 15. eGFR 79. K 4.5 TSH 1.4 LDL 98  2020: Cr 0.8 Hb 15   Review of Systems  Cardiovascular:  Positive for palpitations. Negative for chest pain, dyspnea on exertion, leg swelling and syncope.         Vitals:   07/10/22 0858  BP: 136/88  Pulse: (!) 106  SpO2: 100%     Body mass index is 39.14 kg/m.  Filed Weights   07/10/22 0858  Weight: 228 lb (103.4 kg)     Objective:   Physical Exam Vitals and nursing note reviewed.  Constitutional:      General: She is not in acute distress. Neck:     Vascular: No JVD.  Cardiovascular:     Rate and Rhythm: Normal rate and regular rhythm.     Heart sounds: Normal heart sounds. No murmur heard. Pulmonary:     Effort: Pulmonary effort is normal.     Breath sounds: Normal breath sounds. No wheezing or rales.  Musculoskeletal:     Right lower leg: No edema.     Left lower leg: No edema.          Visit diagnoses:   ICD-10-CM   1. Palpitations  R00.2 EKG 12-Lead    LONG TERM MONITOR (3-14 DAYS)    2. Sinus tachycardia  R00.0 PCV ECHOCARDIOGRAM COMPLETE    3. Family history of early CAD   Z53.49 CT CARDIAC SCORING ([facility] LOCATIONS ONLY)    CANCELED: CT CARDIAC SCORING (SELF PAY ONLY)       Orders Placed This Encounter  Procedures   CT CARDIAC SCORING ([facility] LOCATIONS ONLY)   LONG TERM MONITOR (3-14 DAYS)   EKG 12-Lead   PCV ECHOCARDIOGRAM COMPLETE      Assessment & Recommendations:    42 y.o. Caucasian female with h/o type 2 diabetes mellitus, family history of early CAD, referred for palpitations.  Palpitations:  Most likely PVCs.  Resting tachycardia seem to radiate most likely related to anxiety, as her average heart rate on Apple Watch otherwise is in 60s and 70s at rest.  I will obtain 2-week cardiac telemetry and echocardiogram testing for further evaluation.  At this time, she would prefer not to use medications unless absolutely necessary.  Family history of early CAD: Recommend CT cardiac scoring for risk stratification.  Further recommendations after above testing.  Thank you for referring the patient to [place]. Please feel free to contact with any questions.   Nigel Mormon, MD Pager: [phone] Office: [phone]

## 2022-07-10 ENCOUNTER — Other Ambulatory Visit: Payer: Managed Care, Other (non HMO)

## 2022-07-10 ENCOUNTER — Ambulatory Visit: Payer: Managed Care, Other (non HMO) | Admitting: Cardiology

## 2022-07-10 ENCOUNTER — Encounter: Payer: Self-pay | Admitting: Cardiology

## 2022-07-10 VITALS — BP 136/88 | HR 106 | Ht 64.0 in | Wt 228.0 lb

## 2022-07-10 DIAGNOSIS — R002 Palpitations: Secondary | ICD-10-CM

## 2022-07-10 DIAGNOSIS — Z8249 Family history of ischemic heart disease and other diseases of the circulatory system: Secondary | ICD-10-CM | POA: Insufficient documentation

## 2022-07-10 DIAGNOSIS — R000 Tachycardia, unspecified: Secondary | ICD-10-CM | POA: Insufficient documentation

## 2022-07-21 ENCOUNTER — Ambulatory Visit: Payer: Managed Care, Other (non HMO)

## 2022-07-21 DIAGNOSIS — R000 Tachycardia, unspecified: Secondary | ICD-10-CM

## 2022-10-01 ENCOUNTER — Ambulatory Visit: Payer: Managed Care, Other (non HMO) | Admitting: Neurology

## 2022-10-01 ENCOUNTER — Encounter: Payer: Self-pay | Admitting: Neurology

## 2022-10-01 VITALS — BP 122/82 | HR 88 | Ht 64.0 in | Wt 230.0 lb

## 2022-10-01 DIAGNOSIS — G4733 Obstructive sleep apnea (adult) (pediatric): Secondary | ICD-10-CM | POA: Diagnosis not present

## 2022-10-01 NOTE — Progress Notes (Signed)
Subjective:    Patient ID: Mary Barrett is a 42 y.o. female.  HPI    Interim history:   Mary Barrett is a 42 year old right-handed woman with an underlying medical history of diabetes, hyperlipidemia, anxiety and obesity, who presents for follow-up consultation of her obstructive sleep apnea after interim testing and starting home AutoPap therapy.  The patient is unaccompanied today.  I saw her on 08/29/2022 at the request of her primary care physician for reevaluation of her sleep apnea.  She had previously been diagnosed with mild sleep apnea and wanted to pursue a dental device but it was not covered by her insurance.  She was advised to proceed with a sleep study.  She had a home sleep test on 09/15/2021 which indicated overall mild sleep apnea with an AHI of 6/h, O2 nadir 82% briefly.  She had intermittent mild to moderate snoring.  She was offered AutoPap therapy.  She agreed to proceed, her set up date was 08/17/2022.  She has a ResMed air sense 11 AutoSet machine.  Her DME company is [employer].  Today, 10/01/2022: I reviewed her AutoPap compliance data from 08/31/2022 through 09/29/2022, which is a total of 30 days, during which time she used her machine 26 days with percent use days greater than 4 hours at 60%, indicating mildly suboptimal compliance, average usage for days on treatment of 4 hours and 47 minutes, residual AHI at goal at 2.1/h, average pressure for the 95th percentile at 8.4 cm with a range of 5 to 10 cm with EPR of 3.  Leak on the low side with the 95th percentile at 1.6 L/min. I reviewed her AutoPap compliance data for the first month between 08/23/2022 and 09/21/2022, she used her machine 27 out of 30 days with percent use days greater than 4 hours at 21 days, equaling 70% with adequate compliance, average usage of 4 hours and 33 minutes, residual AHI at goal at 2/h, pressure for the 95th percentile at 8.6 cm and leak on the low side with the 95th percentile at 2.1 L/min. She reports  doing well, still adjusting to treatment, some days are better than others. She feels like she has benefited from it, as in, less EDS, less anxiety at night. She is motivated to continue with treatment.  She uses a fullface mask.  She has had some issues with excess salivation pulling into the mask but this is getting better.  She has no significant mouth dryness.  She has an Epworth sleepiness score of 6 out of 24.  The patient's allergies, current medications, family history, past medical history, past social history, past surgical history and problem list were reviewed and updated as appropriate.   Previously:   08/29/22: (She) was previously diagnosed with overall mild obstructive sleep apnea.  I had evaluated her a few years ago for this sleep apnea.  She had a home sleep test on 12/15/2017 which showed an AHI of 7.8/h, O2 nadir 88%.  She requested to pursue treatment with a dental device and we referred her to Dr. Augustina Mood.  She reports that she never pursued the dental device as it was not covered by her insurance.  She has lost quite a bit of weight after she was diagnosed with diabetes in the spring of last year.  She had an A1c that was above 12 at the time and now it is less than 6.  She still has trouble sleeping, she wakes up in a panic.  She has woken up  with palpitations and sometimes wakes up with a headache.  She has nocturia about once or twice per average night.  Her dad has sleep apnea.  I reviewed your office note from 05/13/2021.  Her Epworth sleepiness score is 7 out of 24, fatigue severity score is 19 out of 63.  She had a recent strep throat infection and was on antibiotics.  When she was younger, she had recurrent tonsillitis.  She lives with her husband and her son.  She has variable work hours, she works as a Geophysicist/field seismologist.  She quit smoking in 2017 and drinks very little alcohol in the form of 1 soda per day on average, she quit drinking alcohol in May 2022 after her diabetes  diagnosis.  Bedtime is generally around 7 but she does not fall asleep until around 10 PM.  Rise time is around 6 AM.  She has had anxiety at night, she has a prescription for Lexapro but did not end up taking it.  She is wondering if her anxiety is due to underlying sleep apnea.    11/10/17: 42 year old right-handed woman with an underlying medical history of diabetes, anxiety, prior smoking, and obesity, who reports snoring and excessive daytime somnolence. She has woken up with a sense of feeling breathless and has had palpitations at night as well as increased sweating and feels anxious at night. She is supposed to start Lexapro. She is married and lives with her husband and 2 children, one stepchild, one biological. She quit smoking in January 2017. She drinks alcohol occasionally, caffeine not daily. I reviewed your office note from 09/21/2017, which you kindly included. Sadly, she recently lost her brother. Her Epworth sleepiness score is 10 out of 24, fatigue score is 37 out of 63.  She co-sleeps with her 32 month old, he sleeps fairly well, no breastfeeding. She has nocturia about once or twice per tablet night, denies morning headaches. Husband sleeps well, although he snores some but it does not seem to disturb her. She endorses restless leg symptoms which are sometimes really bothersome and sometimes less so. She is not sure if she kicks in her sleep. Bedtime varies but generally she tries to be in bed around 8 PM but is not asleep until 10 or 11. Rise time is 4 or 5 AM because her son wakes up around that time. She has a 25 year old stepdaughter as well. 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 She is currently working part-time. She tends to avoid caffeine as it makes her more nervous. Her father has sleep apnea and uses a CPAP machine. She has had issues with nighttime anxiety since she was a teenager. She would wake up with palpitations and anxious. Since her brother passed  away about 2 months ago her anxiety may have become worse. She is seeing a therapist and was given a prescription for Lexapro but was afraid to start it.   Her Past Medical History Is Significant For: Past Medical History:  Diagnosis Date   COVID    Diabetes mellitus    insulin start May 2022 A1C >12   Diabetes mellitus without complication    Gestational diabetes    HLD (hyperlipidemia)    Tonsillar hypertrophy     Her Past Surgical History Is Significant For: Past Surgical History:  Procedure Laterality Date   NO PAST SURGERIES      Her Family History Is Significant For: Family History  Problem Relation Age of Onset   Diabetes  Mother    Hypertension Mother    Diabetes Father    Hypertension Father    CAD Father        CABG around 45   Alcoholism Brother    Dilated cardiomyopathy Brother    Schizophrenia Brother    Cancer Maternal Grandmother     Her Social History Is Significant For: Social History   Socioeconomic History   Marital status: Married    Spouse name: Lennette Bihari   Number of children: 1   Years of education: Not on file   Highest education level: Not on file  Occupational History   Not on file  Tobacco Use   Smoking status: Former    Types: Cigarettes    Quit date: 02/24/2014    Years since quitting: 8.6   Smokeless tobacco: Never  Vaping Use   Vaping Use: Never used  Substance and Sexual Activity   Alcohol use: No    Comment: quit since 10/2020   Drug use: No   Sexual activity: Yes  Other Topics Concern   Not on file  Social History Narrative   Patient is married Lennette Bihari) and lives at home with her husband. Patient does not drink any caffeine. Patient is self-employed    Scientist, physiological Strain: Not on file  Food Insecurity: Not on file  Transportation Needs: Not on file  Physical Activity: Not on file  Stress: Not on file  Social Connections: Not on file    Her Allergies Are:  No Known Allergies:    Her Current Medications Are:  No outpatient encounter medications on file as of 10/01/2022.   No facility-administered encounter medications on file as of 10/01/2022.  :  Review of Systems:  Out of a complete 14 point review of systems, all are reviewed and negative with the exception of these symptoms as listed below:  Review of Systems  Neurological:        Patient is here alone for initial PAP follow-up. Machine setup date was 08/17/22. She reports "it's coming along". Patient is trying to get used to it. She feels like it is helping so far. ESS 6    Objective:  Neurological Exam  Physical Exam Physical Examination:   Vitals:   10/01/22 0959  BP: 122/82  Pulse: 88    General Examination: The patient is a very pleasant 42 y.o. female in no acute distress. She appears well-developed and well-nourished and well groomed.   HEENT: Normocephalic, atraumatic, pupils are equal, round and reactive to light, extraocular tracking is good without limitation to gaze excursion or nystagmus noted.  Corrective eyeglasses in place. Hearing is grossly intact. Face is symmetric with normal facial animation. Speech is clear with no dysarthria noted. There is no hypophonia. There is no lip, neck/head, jaw or voice tremor. Neck is supple with full range of passive and active motion. There are no carotid bruits on auscultation. Oropharynx exam reveals: mild mouth dryness, good dental hygiene and airway crowding, due to tonsils of about 3+.  Tonsils have a cryptic appearance. Mallampati is class I. Tongue protrudes centrally and palate elevates symmetrically.    Chest: Clear to auscultation without wheezing, rhonchi or crackles noted.   Heart: S1+S2+0, regular and normal without murmurs, rubs or gallops noted.    Abdomen: Soft, non-tender and non-distended.   Extremities: There is no pitting edema in the distal lower extremities bilaterally.   Skin: Warm and dry without trophic changes noted.    Musculoskeletal: exam  reveals no obvious joint deformities.    Neurologically:  Mental status: The patient is awake, alert and oriented in all 4 spheres. Her immediate and remote memory, attention, language skills and fund of knowledge are appropriate. There is no evidence of aphasia, agnosia, apraxia or anomia. Speech is clear with normal prosody and enunciation. Thought process is linear. Mood is normal and affect is normal.  Cranial nerves II - XII are as described above under HEENT exam.  Motor exam: Normal bulk, strength and tone is noted. There is no tremor. Fine motor skills and coordination: grossly intact.  Cerebellar testing: No dysmetria or intention tremor. There is no truncal or gait ataxia.  Sensory exam: intact to light touch.  Gait, station and balance: She stands easily. No veering to one side is noted. No leaning to one side is noted. Posture is age-appropriate and stance is narrow based. Gait shows normal stride length and normal pace. No problems turning are noted.    Assessment and Plan:    In summary, Mary Barrett is a very pleasant 42 year old female with an underlying medical history of diabetes, hyperlipidemia, anxiety and obesity, who presents for follow-up consultation of her obstructive sleep apnea after interim testing and starting home AutoPap therapy. She had a home sleep test on 09/15/2021 which indicated overall mild sleep apnea with an AHI of 6/h, O2 nadir 82% briefly.  She had intermittent mild to moderate snoring.  She started home AutoPap therapy on 08/17/2022.  She was very consistent with her usage in the first month, she has had some fluctuation in usage since then, some lapses in treatment as well.  She is overall motivated to continue with treatment and feels that she has benefited from treatment particularly with regards to daytime tiredness and nighttime anxiety.  She is advised to continue to use her AutoPap consistently and work on weight loss, follow-up  routinely in sleep clinic to see one of our nurse practitioners in 1 year.  I answered all her questions today, we talked about her sleep study results and reviewed her compliance data in detail today.  She was in agreement with our plan. I spent 30 minutes in total face-to-face time and in reviewing records during pre-charting, more than 50% of which was spent in counseling and coordination of care, reviewing test results, reviewing medications and treatment regimen and/or in discussing or reviewing the diagnosis of OSA, the prognosis and treatment options. Pertinent laboratory and imaging test results that were available during this visit with the patient were reviewed by me and considered in my medical decision making (see chart for details).

## 2022-10-01 NOTE — Patient Instructions (Signed)
It was nice to see you again today. I understand that you are still adjusting to treatment.  In the first month you have fulfilled the insurance required compliance percentage, which is reassuring, so you can get ongoing supplies through your insurance. Please talk to your DME provider about getting replacement supplies on a regular basis. Please be sure to change your filter every month, your mask about every 3 months, hose about every 6 months, humidifier chamber about yearly. Some restrictions are imposed by your insurance carrier with regard to how frequently you can get certain supplies.  Your DME company can provide further details if necessary.   Please continue using your autoPAP regularly. While your insurance requires that you use PAP at least 4 hours each night on 70% of the nights, I recommend, that you not skip any nights and use it throughout the night if you can. Getting used to PAP and staying with the treatment long term does take time and patience and discipline. Untreated obstructive sleep apnea when it is moderate to severe can have an adverse impact on cardiovascular health and raise her risk for heart disease, arrhythmias, hypertension, congestive heart failure, stroke and diabetes. Untreated obstructive sleep apnea causes sleep disruption, nonrestorative sleep, and sleep deprivation. This can have an impact on your day to day functioning and cause daytime sleepiness and impairment of cognitive function, memory loss, mood disturbance, and problems focussing. Using PAP regularly can improve these symptoms.  We can see you in 1 year, you can see one of our nurse practitioners as you are stable.

## 2023-09-30 ENCOUNTER — Telehealth: Payer: Self-pay | Admitting: Neurology

## 2023-09-30 NOTE — Telephone Encounter (Signed)
 Pt cancelled appointment due to Patient have not used CPAP and no need to come in.

## 2023-10-07 ENCOUNTER — Ambulatory Visit: Payer: Managed Care, Other (non HMO) | Admitting: Adult Health
